# Patient Record
Sex: Male | Born: 1980 | Race: White | Hispanic: No | Marital: Single | Smoking: Current every day smoker
Health system: Southern US, Community
[De-identification: ages and names within clinical notes are randomized; demographics above are authoritative.]

## PROBLEM LIST (undated history)

## (undated) DIAGNOSIS — I10 Essential (primary) hypertension: Secondary | ICD-10-CM

## (undated) DIAGNOSIS — G4730 Sleep apnea, unspecified: Secondary | ICD-10-CM

## (undated) HISTORY — DX: Essential (primary) hypertension: I10

## (undated) HISTORY — PX: WISDOM TOOTH EXTRACTION: SHX21

## (undated) HISTORY — PX: OTHER SURGICAL HISTORY: SHX169

---

## 2007-11-10 ENCOUNTER — Emergency Department: Payer: Self-pay | Admitting: Emergency Medicine
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
## 2012-09-17 DIAGNOSIS — I10 Essential (primary) hypertension: Secondary | ICD-10-CM | POA: Insufficient documentation

## 2013-06-26 ENCOUNTER — Encounter: Payer: Self-pay | Admitting: *Deleted

## 2013-06-28 ENCOUNTER — Ambulatory Visit: Payer: Self-pay | Admitting: Podiatry

## 2013-07-05 ENCOUNTER — Encounter: Payer: Self-pay | Admitting: Podiatry

## 2013-07-05 ENCOUNTER — Ambulatory Visit (INDEPENDENT_AMBULATORY_CARE_PROVIDER_SITE_OTHER): Payer: Managed Care, Other (non HMO) | Admitting: Podiatry

## 2013-07-05 VITALS — BP 128/86 | HR 87 | Resp 16 | Ht 70.0 in | Wt 336.0 lb

## 2013-07-05 DIAGNOSIS — L84 Corns and callosities: Secondary | ICD-10-CM

## 2013-07-05 NOTE — Progress Notes (Signed)
Subjective:     Patient ID: Eugene Mata, male   DOB: [birth year], 32 y.o.   MRN: [id number]  HPI patient states I need to have my calluses trimmed   Review of Systems  All other systems reviewed and are negative.       Objective:   Physical Exam  Nursing note and vitals reviewed. Musculoskeletal: Normal range of motion.   severe callus formation plantar aspect of both feet    Assessment:     Callus formation secondary to pressure bilateral    Plan:     Debridement of lesions plantar aspect of the

## 2014-02-07 ENCOUNTER — Ambulatory Visit (INDEPENDENT_AMBULATORY_CARE_PROVIDER_SITE_OTHER): Payer: Managed Care, Other (non HMO) | Admitting: Podiatry

## 2014-02-07 ENCOUNTER — Encounter: Payer: Self-pay | Admitting: Podiatry

## 2014-02-07 ENCOUNTER — Ambulatory Visit: Payer: Managed Care, Other (non HMO) | Admitting: Podiatry

## 2014-02-07 VITALS — BP 120/81 | HR 90 | Resp 16

## 2014-02-07 DIAGNOSIS — M779 Enthesopathy, unspecified: Secondary | ICD-10-CM

## 2014-02-07 DIAGNOSIS — L84 Corns and callosities: Secondary | ICD-10-CM

## 2014-02-07 NOTE — Progress Notes (Signed)
Subjective:     Patient ID: Eugene Mata, male   DOB: [birth year], 33 y.o.   MRN: [id number]  HPI patient presents stating I am doing better with my feet with orthotics but they're starting to wear down and I need a new pair and also beneath the callus trimmed and whether or not surgery will be in my future   Review of Systems     Objective:   Physical Exam Neurovascular status intact with severe keratotic lesion sub-one sub-5 both feet fifth digit right they're nucleated and painful when pressed along with the big toes of both feet there is fluid buildup underneath the lesions    Assessment:     Capsulitis with inflammatory keratotic lesions noted on both feet that are painful    Plan:     Review different options and at this time continue conservative care with deep debridement accomplished today and patient will have new orthotics fabricated and we will try to rotate between the 2 pair of point when they are ready

## 2014-02-14 ENCOUNTER — Ambulatory Visit: Payer: Self-pay | Admitting: Medical

## 2014-02-21 ENCOUNTER — Encounter: Payer: Self-pay | Admitting: *Deleted

## 2014-02-21 NOTE — Progress Notes (Signed)
Sent pt post card letting him know orthotics are here. 

## 2014-03-14 ENCOUNTER — Encounter: Payer: Self-pay | Admitting: Podiatry

## 2014-03-14 DIAGNOSIS — M79609 Pain in unspecified limb: Secondary | ICD-10-CM

## 2014-05-23 ENCOUNTER — Encounter: Payer: Self-pay | Admitting: Podiatry

## 2014-05-23 ENCOUNTER — Ambulatory Visit (INDEPENDENT_AMBULATORY_CARE_PROVIDER_SITE_OTHER): Payer: Managed Care, Other (non HMO) | Admitting: Podiatry

## 2014-05-23 VITALS — BP 123/82 | HR 98 | Resp 16

## 2014-05-23 DIAGNOSIS — M779 Enthesopathy, unspecified: Secondary | ICD-10-CM

## 2014-05-23 DIAGNOSIS — L84 Corns and callosities: Secondary | ICD-10-CM

## 2014-05-26 NOTE — Progress Notes (Signed)
Subjective:     Patient ID: Eugene Mata, male   DOB: 16-Oct-1980, 33 y.o.   MRN: [id number]  HPI patient presents stating I know I'm going to eventually need surgery but I want to continue conservative care   Review of Systems     Objective:   Physical Exam Severe keratotic lesions of both feet that are painful when pressed along with severe flatfoot deformity and digital deformity    Assessment:     Chronic structural abnormalities creating problem    Plan:     Instructed on routine care dispensed orthotics and debridement lesions with instructions on reduced activity at work. Reappoint her recheck if symptoms persist

## 2016-07-29 ENCOUNTER — Ambulatory Visit
Admission: RE | Admit: 2016-07-29 | Discharge: 2016-07-29 | Disposition: A | Discharge status: Home / Self Care | Payer: 59 | Source: Ambulatory Visit | Attending: Physician Assistant | Admitting: Physician Assistant

## 2016-07-29 ENCOUNTER — Other Ambulatory Visit: Payer: Self-pay | Admitting: Physician Assistant

## 2016-07-29 ENCOUNTER — Ambulatory Visit
Admission: RE | Admit: 2016-07-29 | Discharge: 2016-07-29 | Disposition: A | Discharge status: Home / Self Care | Payer: 59 | Source: Ambulatory Visit | Attending: Family Medicine | Admitting: Family Medicine

## 2016-07-29 DIAGNOSIS — M545 Low back pain: Secondary | ICD-10-CM | POA: Diagnosis not present

## 2016-07-29 DIAGNOSIS — M47894 Other spondylosis, thoracic region: Secondary | ICD-10-CM | POA: Insufficient documentation

## 2017-02-24 ENCOUNTER — Ambulatory Visit (INDEPENDENT_AMBULATORY_CARE_PROVIDER_SITE_OTHER): Payer: Managed Care, Other (non HMO) | Admitting: Podiatry

## 2017-02-24 ENCOUNTER — Encounter: Payer: Self-pay | Admitting: Podiatry

## 2017-02-24 DIAGNOSIS — M79672 Pain in left foot: Secondary | ICD-10-CM | POA: Diagnosis not present

## 2017-02-24 DIAGNOSIS — M2142 Flat foot [pes planus] (acquired), left foot: Secondary | ICD-10-CM

## 2017-02-24 DIAGNOSIS — M79671 Pain in right foot: Secondary | ICD-10-CM

## 2017-02-24 DIAGNOSIS — M2141 Flat foot [pes planus] (acquired), right foot: Secondary | ICD-10-CM

## 2017-02-24 DIAGNOSIS — Q828 Other specified congenital malformations of skin: Secondary | ICD-10-CM | POA: Diagnosis not present

## 2017-02-24 DIAGNOSIS — M7742 Metatarsalgia, left foot: Secondary | ICD-10-CM

## 2017-02-24 DIAGNOSIS — M7741 Metatarsalgia, right foot: Secondary | ICD-10-CM

## 2017-02-26 NOTE — Progress Notes (Addendum)
   Subjective: Patient presents to the office today for chief complaint of painful callus lesions of the feet. Patient states that the pain is ongoing and is affecting their ability to ambulate without pain. Patient presents today for further treatment and evaluation.  Objective:  Physical Exam General: Alert and oriented x3 in no acute distress  Dermatology: Hyperkeratotic lesion present on the bilaterally x 5. Pain on palpation with a central nucleated core noted.  Skin is warm, dry and supple bilateral lower extremities. Negative for open lesions or macerations.  Vascular: Palpable pedal pulses bilaterally. No edema or erythema noted. Capillary refill within normal limits.  Neurological: Epicritic and protective threshold grossly intact bilaterally.   Musculoskeletal Exam: Pain on palpation at the keratotic lesion noted. Range of motion within normal limits bilateral. Muscle strength 5/5 in all groups bilateral.  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  #3 Treated area(s) with Salinocaine and dressed with light dressing. #4 Scanned for new orthotics.  #5 Patient is to return 3-4 weeks to pick up orthotics.   Felecia ShellingBrent M. Evans, DPM [facility]  Dr. Felecia ShellingBrent M. Evans, DPM    [address]                                        [place], [place] [address]                Office [phone]  Fax [phone]

## 2017-04-26 ENCOUNTER — Encounter: Payer: Self-pay | Admitting: *Deleted

## 2017-10-07 DIAGNOSIS — Z800 Family history of malignant neoplasm of digestive organs: Secondary | ICD-10-CM | POA: Diagnosis not present

## 2017-10-07 DIAGNOSIS — I10 Essential (primary) hypertension: Secondary | ICD-10-CM | POA: Diagnosis not present

## 2017-11-11 IMAGING — CR DG LUMBAR SPINE COMPLETE 4+V
1 series · 5 of 5 positions shown · non-contrast
Comparison: None.

CLINICAL DATA: 35-year-old male with a history of lumbar back pain

EXAM:
LUMBAR SPINE - COMPLETE 4+ VIEW

[Series 1: dg lumbar spine complete 4 +v · 0.14mm/px · 5 of 5 slices shown]
[im 1/5]
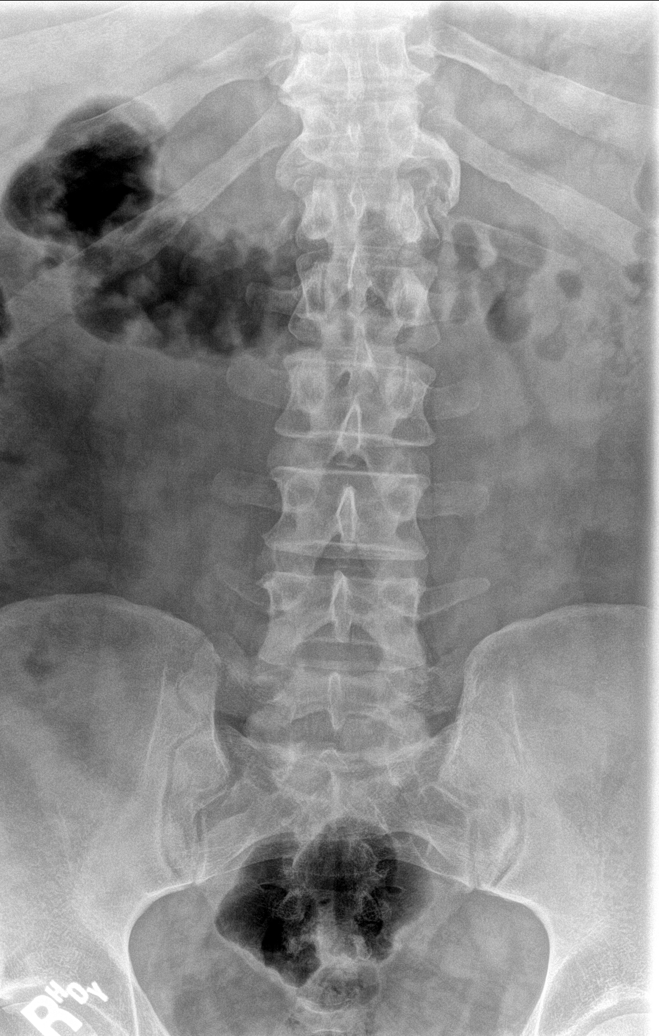
[im 2/5]
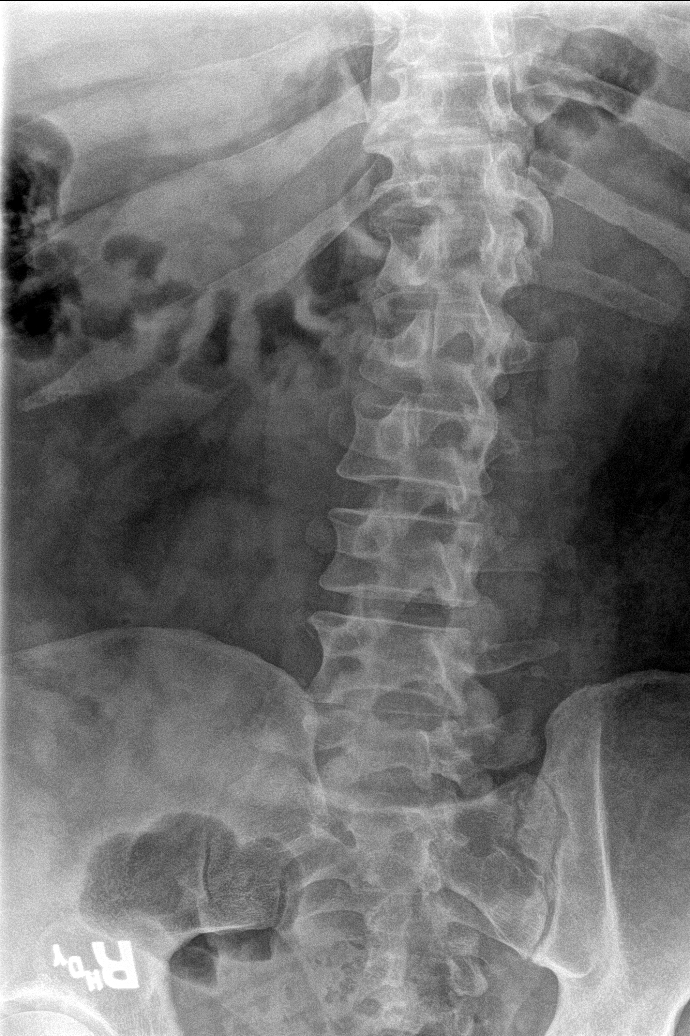
[im 3/5]
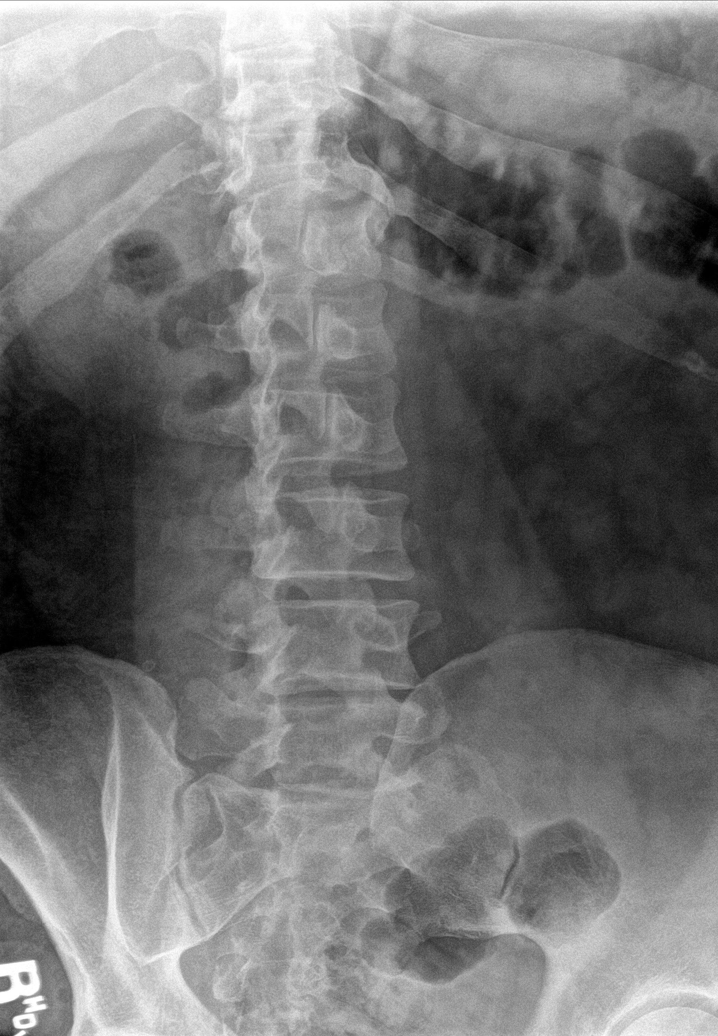
[im 4/5]
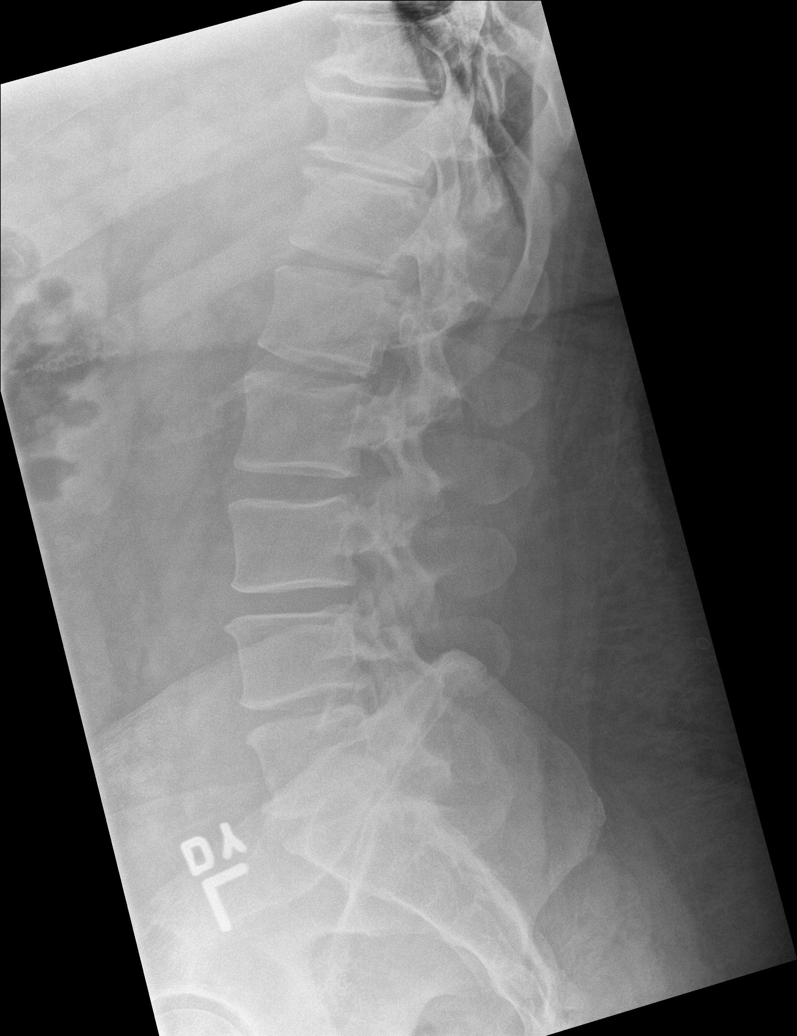
[im 5/5]
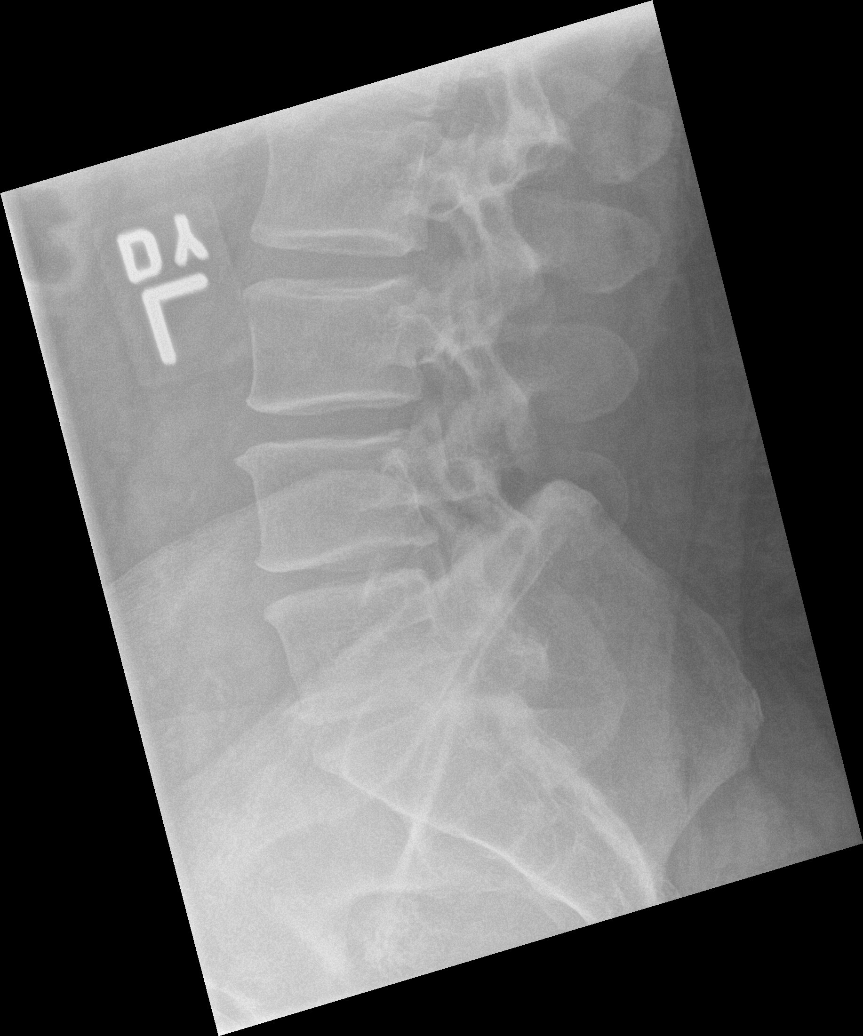

[5 of 5 positions shown; findings below may reference images not displayed]

FINDINGS: Lumbar Spine:

Numbering system presumes a transitional vertebral body at the L6-S1
level with lumbarization of the first sacral element. This also
presumes a set of vestigial ribs at the L1 level. No chest x-ray
available for comparison.

Lumbar vertebral elements maintain normal alignment without evidence
of anterolisthesis, retrolisthesis, subluxation.

No fracture line identified.  Vertebral body heights maintained.

Degenerative disc disease worst in the lower thoracic spine with
anterior osteophyte production and endplate sclerosis.

Oblique images demonstrate no displaced pars defect.

Unremarkable appearance of the visualized abdomen.
IMPRESSION: Negative for acute fracture or malalignment of the lumbar spine.

Degenerative changes of the lower thoracic levels.

Numbering system of the lumbar elements, as above.

Insert

## 2017-11-14 DIAGNOSIS — G4733 Obstructive sleep apnea (adult) (pediatric): Secondary | ICD-10-CM | POA: Diagnosis not present

## 2017-12-01 DIAGNOSIS — Z800 Family history of malignant neoplasm of digestive organs: Secondary | ICD-10-CM | POA: Diagnosis not present

## 2018-01-15 DIAGNOSIS — L720 Epidermal cyst: Secondary | ICD-10-CM | POA: Diagnosis not present

## 2018-02-05 ENCOUNTER — Ambulatory Visit: Admission: RE | Admit: 2018-02-05 | Payer: 59 | Source: Ambulatory Visit | Admitting: Gastroenterology

## 2018-02-05 ENCOUNTER — Encounter: Admission: RE | Payer: Self-pay | Source: Ambulatory Visit

## 2018-02-05 SURGERY — COLONOSCOPY WITH PROPOFOL
Anesthesia: General

## 2018-02-20 DIAGNOSIS — G4733 Obstructive sleep apnea (adult) (pediatric): Secondary | ICD-10-CM | POA: Diagnosis not present

## 2018-03-08 ENCOUNTER — Ambulatory Visit
Admission: RE | Admit: 2018-03-08 | Discharge: 2018-03-08 | Disposition: A | Discharge status: Home / Self Care | Payer: BLUE CROSS/BLUE SHIELD | Source: Ambulatory Visit | Attending: Gastroenterology | Admitting: Gastroenterology

## 2018-03-08 ENCOUNTER — Ambulatory Visit: Payer: BLUE CROSS/BLUE SHIELD | Admitting: Certified Registered"

## 2018-03-08 ENCOUNTER — Encounter: Payer: Self-pay | Admitting: *Deleted

## 2018-03-08 ENCOUNTER — Encounter
Admission: RE | Disposition: A | Discharge status: Home / Self Care | Payer: Self-pay | Source: Ambulatory Visit | Attending: Gastroenterology

## 2018-03-08 DIAGNOSIS — Z6841 Body Mass Index (BMI) 40.0 and over, adult: Secondary | ICD-10-CM | POA: Insufficient documentation

## 2018-03-08 DIAGNOSIS — K635 Polyp of colon: Secondary | ICD-10-CM | POA: Insufficient documentation

## 2018-03-08 DIAGNOSIS — D128 Benign neoplasm of rectum: Secondary | ICD-10-CM | POA: Diagnosis not present

## 2018-03-08 DIAGNOSIS — Z800 Family history of malignant neoplasm of digestive organs: Secondary | ICD-10-CM | POA: Insufficient documentation

## 2018-03-08 DIAGNOSIS — F17200 Nicotine dependence, unspecified, uncomplicated: Secondary | ICD-10-CM | POA: Insufficient documentation

## 2018-03-08 DIAGNOSIS — I10 Essential (primary) hypertension: Secondary | ICD-10-CM | POA: Diagnosis not present

## 2018-03-08 DIAGNOSIS — Z8371 Family history of colonic polyps: Secondary | ICD-10-CM | POA: Insufficient documentation

## 2018-03-08 DIAGNOSIS — K5730 Diverticulosis of large intestine without perforation or abscess without bleeding: Secondary | ICD-10-CM | POA: Insufficient documentation

## 2018-03-08 DIAGNOSIS — Z1211 Encounter for screening for malignant neoplasm of colon: Secondary | ICD-10-CM | POA: Insufficient documentation

## 2018-03-08 DIAGNOSIS — D125 Benign neoplasm of sigmoid colon: Secondary | ICD-10-CM | POA: Diagnosis not present

## 2018-03-08 DIAGNOSIS — D127 Benign neoplasm of rectosigmoid junction: Secondary | ICD-10-CM | POA: Diagnosis not present

## 2018-03-08 DIAGNOSIS — Z9989 Dependence on other enabling machines and devices: Secondary | ICD-10-CM | POA: Insufficient documentation

## 2018-03-08 DIAGNOSIS — G4730 Sleep apnea, unspecified: Secondary | ICD-10-CM | POA: Insufficient documentation

## 2018-03-08 DIAGNOSIS — K621 Rectal polyp: Secondary | ICD-10-CM | POA: Insufficient documentation

## 2018-03-08 DIAGNOSIS — K5790 Diverticulosis of intestine, part unspecified, without perforation or abscess without bleeding: Secondary | ICD-10-CM | POA: Diagnosis not present

## 2018-03-08 HISTORY — DX: Sleep apnea, unspecified: G47.30

## 2018-03-08 HISTORY — PX: COLONOSCOPY WITH PROPOFOL: SHX5780

## 2018-03-08 SURGERY — COLONOSCOPY WITH PROPOFOL
Anesthesia: General

## 2018-03-08 MED ORDER — PROPOFOL 500 MG/50ML IV EMUL
INTRAVENOUS | Status: DC | PRN
  Administered 2018-03-08: 125 ug/kg/min via INTRAVENOUS

## 2018-03-08 MED ORDER — PROPOFOL 10 MG/ML IV BOLUS
INTRAVENOUS | Status: AC
  Filled 2018-03-08: qty 40

## 2018-03-08 MED ORDER — PROPOFOL 500 MG/50ML IV EMUL
INTRAVENOUS | Status: AC
  Filled 2018-03-08: qty 50

## 2018-03-08 MED ORDER — LIDOCAINE HCL (CARDIAC) PF 100 MG/5ML IV SOSY
PREFILLED_SYRINGE | INTRAVENOUS | Status: DC | PRN
  Administered 2018-03-08: 80 mg via INTRAVENOUS

## 2018-03-08 MED ORDER — PROPOFOL 10 MG/ML IV BOLUS
INTRAVENOUS | Status: AC
  Filled 2018-03-08: qty 20

## 2018-03-08 MED ORDER — SODIUM CHLORIDE 0.9 % IV SOLN
INTRAVENOUS | Status: DC
  Administered 2018-03-08: 12:00:00 via INTRAVENOUS

## 2018-03-08 MED ORDER — PROPOFOL 10 MG/ML IV BOLUS
INTRAVENOUS | Status: DC | PRN
  Administered 2018-03-08: 100 mg via INTRAVENOUS
  Administered 2018-03-08 (×2): 50 mg via INTRAVENOUS

## 2018-03-08 NOTE — Anesthesia Postprocedure Evaluation (Signed)
Anesthesia Post Note  Patient: Eugene Mata  Procedure(s) Performed: COLONOSCOPY WITH PROPOFOL (N/A )  Patient location during evaluation: Endoscopy Anesthesia Type: General Level of consciousness: awake and alert Pain management: pain level controlled Vital Signs Assessment: post-procedure vital signs reviewed and stable Respiratory status: spontaneous breathing, nonlabored ventilation, respiratory function stable and patient connected to nasal cannula oxygen Cardiovascular status: blood pressure returned to baseline and stable Postop Assessment: no apparent nausea or vomiting Anesthetic complications: no     Last Vitals:  Vitals:   03/08/18 1235 03/08/18 1245  BP: 102/65 116/67  Pulse:    Resp:    Temp:    SpO2:      Last Pain:  Vitals:   03/08/18 1245  TempSrc:   PainSc: 0-No pain                 Cleda MccreedyJoseph K Julieth Tugman

## 2018-03-08 NOTE — H&P (Signed)
Outpatient short stay form Pre-procedure 03/08/2018 11:35 AM Eugene DeemMartin U Skulskie MD  Primary Physician: Iantha FallenLeonard Polanco, MD  Reason for visit: Colonoscopy  History of present illness: Patient is a [age] year old male presenting today as above.  There is a family history of colon cancer in a primary relative, sister the age of about 6444.  Colon polyps in her brother in his 2040s.  His prep well.  He takes no aspirin or blood thinning agent.    Current Facility-Administered Medications:  .  0.9 %  sodium chloride infusion, , Intravenous, Continuous, Eugene DeemSkulskie, Martin U, MD, Last Rate: 20 mL/hr at 03/08/18 1132  Medications Prior to Admission  Medication Sig Dispense Refill Last Dose  . lisinopril-hydrochlorothiazide (PRINZIDE,ZESTORETIC) 20-25 MG per tablet Take 1 tablet by mouth daily.   03/08/2018 at 0600     No Known Allergies   Past Medical History:  Diagnosis Date  . Hypertension   . Sleep apnea    uses CPAP at home    Review of systems:      Physical Exam    Heart and lungs: Regular rate and rhythm without rub or gallop, lungs are bilaterally clear.    HEENT: Normocephalic atraumatic eyes are anicteric    Other:    Pertinant exam for procedure: Soft nontender nondistended bowel sounds positive normoactive    Planned proceedures: Colonoscopy and indicated procedures. I have discussed the risks benefits and complications of procedures to include not limited to bleeding, infection, perforation and the risk of sedation and the patient wishes to proceed.    Eugene DeemMartin U Skulskie, MD Gastroenterology 03/08/2018  11:35 AM

## 2018-03-08 NOTE — Transfer of Care (Signed)
Immediate Anesthesia Transfer of Care Note  Patient: Eugene Mata  Procedure(s) Performed: COLONOSCOPY WITH PROPOFOL (N/A )  Patient Location: PACU  Anesthesia Type:General  Level of Consciousness: awake, alert  and oriented  Airway & Oxygen Therapy: Patient Spontanous Breathing and Patient connected to nasal cannula oxygen  Post-op Assessment: Report given to RN and Post -op Vital signs reviewed and stable  Post vital signs: Reviewed and stable  Last Vitals:  Vitals Value Taken Time  BP    Temp    Pulse 84 03/08/2018 12:28 PM  Resp 16 03/08/2018 12:28 PM  SpO2 99 % 03/08/2018 12:28 PM  Vitals shown include unvalidated device data.  Last Pain:  Vitals:   03/08/18 1225  TempSrc: (P) Tympanic  PainSc:          Complications: No apparent anesthesia complications

## 2018-03-08 NOTE — Anesthesia Preprocedure Evaluation (Signed)
Anesthesia Evaluation  Patient identified by MRN, date of birth, ID band Patient awake    Reviewed: Allergy & Precautions, H&P , NPO status , Patient's Chart, lab work & pertinent test results  History of Anesthesia Complications Negative for: history of anesthetic complications  Airway Mallampati: III  TM Distance: >3 FB Neck ROM: full    Dental  (+) Chipped, Poor Dentition   Pulmonary sleep apnea and Continuous Positive Airway Pressure Ventilation , Current Smoker,           Cardiovascular Exercise Tolerance: Good hypertension, (-) angina(-) Past MI and (-) DOE      Neuro/Psych negative neurological ROS  negative psych ROS   GI/Hepatic negative GI ROS, Neg liver ROS,   Endo/Other  Morbid obesity  Renal/GU negative Renal ROS  negative genitourinary   Musculoskeletal   Abdominal   Peds  Hematology negative hematology ROS (+)   Anesthesia Other Findings Past Medical History: No date: Hypertension No date: Sleep apnea     Comment:  uses CPAP at home  Past Surgical History: No date: head cyst removal No date: WISDOM TOOTH EXTRACTION  BMI    Body Mass Index:  41.61 kg/m      Reproductive/Obstetrics negative OB ROS                             Anesthesia Physical Anesthesia Plan  ASA: III  Anesthesia Plan: General   Post-op Pain Management:    Induction: Intravenous  PONV Risk Score and Plan: Propofol infusion and TIVA  Airway Management Planned: Natural Airway and Nasal Cannula  Additional Equipment:   Intra-op Plan:   Post-operative Plan:   Informed Consent: I have reviewed the patients History and Physical, chart, labs and discussed the procedure including the risks, benefits and alternatives for the proposed anesthesia with the patient or authorized representative who has indicated his/her understanding and acceptance.   Dental Advisory Given  Plan Discussed  with: Anesthesiologist, CRNA and Surgeon  Anesthesia Plan Comments: (Patient consented for risks of anesthesia including but not limited to:  - adverse reactions to medications - risk of intubation if required - damage to teeth, lips or other oral mucosa - sore throat or hoarseness - Damage to heart, brain, lungs or loss of life  Patient voiced understanding.)        Anesthesia Quick Evaluation

## 2018-03-08 NOTE — Op Note (Signed)
[facility] Patient Name: Eugene Mata Procedure Date: 03/08/2018 11:41 AM MRN: [id number] Account #: [id number] Date of Birth: [birth year] Admit Type: Outpatient Age: 37 Room: [facility] ENDO ROOM 3 Gender: Male Note Status: Finalized Procedure:            Colonoscopy Indications:          Family history of colon cancer in a first-degree                        relative, Family history of colonic polyps in a                        first-degree relative Providers:            Christena Deem, MD Medicines:            Monitored Anesthesia Care Complications:        No immediate complications. Procedure:            Pre-Anesthesia Assessment:                       - ASA Grade Assessment: III - A patient with severe                        systemic disease.                       After obtaining informed consent, the colonoscope was                        passed under direct vision. Throughout the procedure,                        the patient's blood pressure, pulse, and oxygen                        saturations were monitored continuously. The                        Colonoscope was introduced through the anus and                        advanced to the the cecum, identified by appendiceal                        orifice and ileocecal valve. The colonoscopy was                        performed without difficulty. The patient tolerated the                        procedure well. The quality of the bowel preparation                        was good. Findings:      Three sessile polyps were found in the recto-sigmoid colon. The polyps       were 2 to 5 mm in size. These polyps were removed with a cold snare.       Resection and retrieval were complete.      A 6 mm polyp was found in the distal sigmoid colon. The polyp was  sessile. The polyp was removed with a cold snare. Resection and       retrieval were complete.      Five sessile polyps were found in the  rectum. The polyps were 1 to 3 mm       in size. These polyps were removed with a cold biopsy forceps. Resection       and retrieval were complete.      A few small-mouthed diverticula were found in the sigmoid colon,       descending colon and distal descending colon.      The digital rectal exam was normal.      The retroflexed view of the distal rectum and anal verge was normal and       showed no anal or rectal abnormalities. Impression:           - Three 2 to 5 mm polyps at the recto-sigmoid colon,                        removed with a cold snare. Resected and retrieved.                       - One 6 mm polyp in the distal sigmoid colon, removed                        with a cold snare. Resected and retrieved.                       - Five 1 to 3 mm polyps in the rectum, removed with a                        cold biopsy forceps. Resected and retrieved.                       - Diverticulosis in the sigmoid colon, in the                        descending colon and in the distal descending colon.                       - The distal rectum and anal verge are normal on                        retroflexion view. Recommendation:       - Discharge patient to home. Procedure Code(s):    --- Professional ---                       [phone] [id number], Colonoscopy, flexible; with removal of tumor(s),                        polyp(s), or other lesion(s) by snare technique                       45380, 59, Colonoscopy, flexible; with biopsy, single                        or multiple Diagnosis Code(s):    --- Professional ---                       D12.7, Benign  neoplasm of rectosigmoid junction                       K62.1, Rectal polyp                       D12.5, Benign neoplasm of sigmoid colon                       Z80.0, Family history of malignant neoplasm of                        digestive organs                       Z83.71, Family history of colonic polyps                       K57.30, Diverticulosis of  large intestine without                        perforation or abscess without bleeding CPT copyright 2017 American Medical Association. All rights reserved. The codes documented in this report are preliminary and upon coder review may  be revised to meet current compliance requirements. Christena Deem, MD 03/08/2018 12:25:58 PM This report has been signed electronically. Number of Addenda: 0 Note Initiated On: 03/08/2018 11:41 AM Scope Withdrawal Time: 0 hours 17 minutes 32 seconds  Total Procedure Duration: 0 hours 32 minutes 11 seconds       [facility]

## 2018-03-08 NOTE — Anesthesia Post-op Follow-up Note (Signed)
Anesthesia QCDR form completed.        

## 2018-03-09 LAB — SURGICAL PATHOLOGY

## 2018-05-01 DIAGNOSIS — L988 Other specified disorders of the skin and subcutaneous tissue: Secondary | ICD-10-CM | POA: Diagnosis not present

## 2018-05-01 DIAGNOSIS — L905 Scar conditions and fibrosis of skin: Secondary | ICD-10-CM | POA: Diagnosis not present

## 2018-05-01 DIAGNOSIS — D485 Neoplasm of uncertain behavior of skin: Secondary | ICD-10-CM | POA: Diagnosis not present

## 2018-05-01 DIAGNOSIS — L720 Epidermal cyst: Secondary | ICD-10-CM | POA: Diagnosis not present

## 2018-05-08 DIAGNOSIS — L905 Scar conditions and fibrosis of skin: Secondary | ICD-10-CM | POA: Diagnosis not present

## 2018-05-08 DIAGNOSIS — L720 Epidermal cyst: Secondary | ICD-10-CM | POA: Diagnosis not present

## 2018-05-08 DIAGNOSIS — D1739 Benign lipomatous neoplasm of skin and subcutaneous tissue of other sites: Secondary | ICD-10-CM | POA: Diagnosis not present

## 2018-05-09 DIAGNOSIS — I10 Essential (primary) hypertension: Secondary | ICD-10-CM | POA: Diagnosis not present

## 2018-10-03 DIAGNOSIS — G4733 Obstructive sleep apnea (adult) (pediatric): Secondary | ICD-10-CM | POA: Diagnosis not present

## 2018-11-17 DIAGNOSIS — I10 Essential (primary) hypertension: Secondary | ICD-10-CM | POA: Diagnosis not present

## 2018-11-17 DIAGNOSIS — F17290 Nicotine dependence, other tobacco product, uncomplicated: Secondary | ICD-10-CM | POA: Diagnosis not present

## 2019-01-02 DIAGNOSIS — G4733 Obstructive sleep apnea (adult) (pediatric): Secondary | ICD-10-CM | POA: Diagnosis not present

## 2019-04-02 DIAGNOSIS — G4733 Obstructive sleep apnea (adult) (pediatric): Secondary | ICD-10-CM | POA: Diagnosis not present

## 2019-05-06 DIAGNOSIS — K635 Polyp of colon: Secondary | ICD-10-CM | POA: Diagnosis not present

## 2019-05-06 DIAGNOSIS — E663 Overweight: Secondary | ICD-10-CM | POA: Diagnosis not present

## 2019-05-06 DIAGNOSIS — I10 Essential (primary) hypertension: Secondary | ICD-10-CM | POA: Diagnosis not present

## 2019-05-07 DIAGNOSIS — R5381 Other malaise: Secondary | ICD-10-CM | POA: Diagnosis not present

## 2019-05-07 DIAGNOSIS — E7849 Other hyperlipidemia: Secondary | ICD-10-CM | POA: Diagnosis not present

## 2019-05-07 DIAGNOSIS — I10 Essential (primary) hypertension: Secondary | ICD-10-CM | POA: Diagnosis not present

## 2019-05-14 DIAGNOSIS — E662 Morbid (severe) obesity with alveolar hypoventilation: Secondary | ICD-10-CM | POA: Diagnosis not present

## 2019-05-14 DIAGNOSIS — I119 Hypertensive heart disease without heart failure: Secondary | ICD-10-CM | POA: Diagnosis not present

## 2019-06-03 ENCOUNTER — Ambulatory Visit: Payer: BLUE CROSS/BLUE SHIELD | Admitting: Internal Medicine

## 2019-07-09 DIAGNOSIS — G4733 Obstructive sleep apnea (adult) (pediatric): Secondary | ICD-10-CM | POA: Diagnosis not present

## 2019-10-29 ENCOUNTER — Other Ambulatory Visit: Payer: Self-pay

## 2019-10-29 ENCOUNTER — Telehealth: Payer: Self-pay

## 2019-10-29 ENCOUNTER — Encounter: Payer: Self-pay | Admitting: Family Medicine

## 2019-10-29 ENCOUNTER — Ambulatory Visit (INDEPENDENT_AMBULATORY_CARE_PROVIDER_SITE_OTHER): Payer: BC Managed Care – PPO | Admitting: Family Medicine

## 2019-10-29 VITALS — BP 123/71 | HR 84 | Temp 97.9°F | Resp 16 | Ht 69.5 in | Wt 377.5 lb

## 2019-10-29 DIAGNOSIS — I10 Essential (primary) hypertension: Secondary | ICD-10-CM

## 2019-10-29 DIAGNOSIS — K635 Polyp of colon: Secondary | ICD-10-CM | POA: Insufficient documentation

## 2019-10-29 DIAGNOSIS — Z720 Tobacco use: Secondary | ICD-10-CM | POA: Diagnosis not present

## 2019-10-29 DIAGNOSIS — G4733 Obstructive sleep apnea (adult) (pediatric): Secondary | ICD-10-CM | POA: Insufficient documentation

## 2019-10-29 DIAGNOSIS — Z800 Family history of malignant neoplasm of digestive organs: Secondary | ICD-10-CM | POA: Insufficient documentation

## 2019-10-29 NOTE — Assessment & Plan Note (Signed)
Return in 5 years. Updated health maintenance

## 2019-10-29 NOTE — Assessment & Plan Note (Signed)
BP at goal. Continue lisinopril/HCTZ. Encouraged diet/exercise. Will get outside labs. Return in 4 months for repeat annual labs.

## 2019-10-29 NOTE — Telephone Encounter (Signed)
Left message for patient to call back. Wanted to see if patient remembers his previous doctor's address he was seen after Urgent Care in [place] closed. Previous doctor's name I have is Dr Juel Burrow but per google there are several possible locations for this doctor.

## 2019-10-29 NOTE — Assessment & Plan Note (Signed)
Encouraged weight loss. Pt will try working on diet and add exercise later. Discussed possibility of surgery - not interested at this time, also unsure what the insurance coverage would be.

## 2019-10-29 NOTE — Assessment & Plan Note (Signed)
Uses CPAP but interested in a new one. Advised him to contact supply to send over request to our office. Encouraged weight loss as a way to decrease sleep apnea.

## 2019-10-29 NOTE — Assessment & Plan Note (Signed)
Not interested in quitting at this time

## 2019-10-29 NOTE — Progress Notes (Signed)
Subjective:     Eugene Mata is a 39 y.o. male presenting for Establish Care (was going to [facility] but they closed down. then was seen Dr Corky Downs)     HPI   #HTN - takes medication every morning - no cp, sob, ha - combination lisinopril-HCTZ 20-25  #Sleep apnea - interested in a new machine  #Tobacco use - not interested in quitting  #Obesity - previously had lost down to 290 - was doing plan Z > using a spray and low calorie diet  Review of Systems   Social History   Tobacco Use  Smoking Status Current Every Day Smoker  . Packs/day: 1.00  . Years: 23.00  . Pack years: 23.00  . Types: Cigarettes  Smokeless Tobacco Former Neurosurgeon  . Types: Snuff  . Quit date: 1999        Objective:    BP Readings from Last 3 Encounters:  10/29/19 123/71  03/08/18 116/67  05/23/14 123/82   Wt Readings from Last 3 Encounters:  10/29/19 (!) 377 lb 8 oz (171.2 kg)  03/08/18 290 lb (131.5 kg)  07/05/13 (!) 336 lb (152.4 kg)    BP 123/71   Pulse 84   Temp 97.9 F (36.6 C)   Resp 16   Ht 5' 9.5" (1.765 m)   Wt (!) 377 lb 8 oz (171.2 kg)   SpO2 96%   BMI 54.95 kg/m    Physical Exam Constitutional:      Appearance: Normal appearance. He is obese. He is not ill-appearing or diaphoretic.  HENT:     Right Ear: External ear normal.     Left Ear: External ear normal.     Nose: Nose normal.  Eyes:     General: No scleral icterus.    Extraocular Movements: Extraocular movements intact.     Conjunctiva/sclera: Conjunctivae normal.  Cardiovascular:     Rate and Rhythm: Normal rate and regular rhythm.     Heart sounds: No murmur.  Pulmonary:     Effort: Pulmonary effort is normal. No respiratory distress.     Breath sounds: Normal breath sounds. No wheezing.  Musculoskeletal:     Cervical back: Neck supple.  Skin:    General: Skin is warm and dry.  Neurological:     Mental Status: He is alert. Mental status is at baseline.  Psychiatric:      Mood and Affect: Mood normal.           Assessment & Plan:   Problem List Items Addressed This Visit      Cardiovascular and Mediastinum   HTN (hypertension) - Primary    BP at goal. Continue lisinopril/HCTZ. Encouraged diet/exercise. Will get outside labs. Return in 4 months for repeat annual labs.         Respiratory   Sleep apnea, obstructive    Uses CPAP but interested in a new one. Advised him to contact supply to send over request to our office. Encouraged weight loss as a way to decrease sleep apnea.         Digestive   Colon polyps    Return in 5 years. Updated health maintenance         Other   Morbid obesity (HCC)    Encouraged weight loss. Pt will try working on diet and add exercise later. Discussed possibility of surgery - not interested at this time, also unsure what the insurance coverage would be.       Tobacco  use    Not interested in quitting at this time      Family history of colon cancer       Return in about 4 months (around 02/26/2020).  Lesleigh Noe, MD

## 2019-10-29 NOTE — Patient Instructions (Addendum)
#   Weight loss - Start working on [employer] - weight loss could decrease your need for your CPAP machine and you may not need as many blood pressure medications  - return in 4 months  #Blood pressure - keep taking your medication  #CPAP - contact company and have them send over request for the needed supplies - with settings included

## 2019-10-30 NOTE — Telephone Encounter (Signed)
[facility] [address] [place] [address]  Phone [phone]

## 2019-10-30 NOTE — Telephone Encounter (Signed)
Noted thank you. Records requested

## 2020-01-14 DIAGNOSIS — G4733 Obstructive sleep apnea (adult) (pediatric): Secondary | ICD-10-CM | POA: Diagnosis not present

## 2020-03-30 ENCOUNTER — Other Ambulatory Visit: Payer: Self-pay

## 2020-03-30 ENCOUNTER — Ambulatory Visit (INDEPENDENT_AMBULATORY_CARE_PROVIDER_SITE_OTHER): Payer: BC Managed Care – PPO | Admitting: Family Medicine

## 2020-03-30 ENCOUNTER — Encounter: Payer: Self-pay | Admitting: Family Medicine

## 2020-03-30 VITALS — BP 100/58 | HR 78 | Temp 97.5°F | Wt 367.0 lb

## 2020-03-30 DIAGNOSIS — Z720 Tobacco use: Secondary | ICD-10-CM

## 2020-03-30 DIAGNOSIS — Z114 Encounter for screening for human immunodeficiency virus [HIV]: Secondary | ICD-10-CM | POA: Diagnosis not present

## 2020-03-30 DIAGNOSIS — Z1159 Encounter for screening for other viral diseases: Secondary | ICD-10-CM

## 2020-03-30 DIAGNOSIS — I10 Essential (primary) hypertension: Secondary | ICD-10-CM

## 2020-03-30 LAB — LIPID PANEL
Cholesterol: 185 mg/dL (ref 0–200)
HDL: 31.8 mg/dL — ABNORMAL LOW (ref >39.00)
LDL Cholesterol: 130 mg/dL — ABNORMAL HIGH (ref 0–99)
NonHDL: 153.05
Total CHOL/HDL Ratio: 6
Triglycerides: 116 mg/dL (ref 0.0–149.0)
VLDL: 23.2 mg/dL (ref 0.0–40.0)

## 2020-03-30 LAB — COMPREHENSIVE METABOLIC PANEL
ALT: 27 U/L (ref 0–53)
AST: 19 U/L (ref 0–37)
Albumin: 4 g/dL (ref 3.5–5.2)
Alkaline Phosphatase: 55 U/L (ref 39–117)
BUN: 9 mg/dL (ref 6–23)
CO2: 28 mEq/L (ref 19–32)
Calcium: 9.5 mg/dL (ref 8.4–10.5)
Chloride: 101 mEq/L (ref 96–112)
Creatinine, Ser: 0.77 mg/dL (ref 0.40–1.50)
GFR: 112.52 mL/min (ref >60.00)
Glucose, Bld: 95 mg/dL (ref 70–99)
Potassium: 3.6 mEq/L (ref 3.5–5.1)
Sodium: 137 mEq/L (ref 135–145)
Total Bilirubin: 0.4 mg/dL (ref 0.2–1.2)
Total Protein: 7.2 g/dL (ref 6.0–8.3)

## 2020-03-30 LAB — HEMOGLOBIN A1C: Hgb A1c MFr Bld: 5.6 % (ref 4.6–6.5)

## 2020-03-30 MED ORDER — LISINOPRIL 20 MG PO TABS: 20.0000 mg | ORAL_TABLET | Freq: Every day | ORAL | 3 refills | Status: DC

## 2020-03-30 NOTE — Patient Instructions (Addendum)
#  Blood pressure - After you finish the current bottle  - stop the combination medicine - fill Lisinopril 20 mg  - keep working on using the stairs at work and portion control

## 2020-03-30 NOTE — Assessment & Plan Note (Signed)
BP low and at goal. Discussed switching to lisinopril as single agent. Return in 6 months.

## 2020-03-30 NOTE — Progress Notes (Signed)
Subjective:     Eugene Mata is a 39 y.o. male presenting for Follow-up (hypertension)     HPI   #Obesity - has not made major changes - does not feel he has done anything - is back at work - use the stairs at his job - small diet changes - smaller portions  #HTN - has not been checking at home - no cp, ha, dizziness - feels fine  #Tobacco  - not interested in quitting at this time  Review of Systems   Social History   Tobacco Use  Smoking Status Current Every Day Smoker  . Packs/day: 1.00  . Years: 23.00  . Pack years: 23.00  . Types: Cigarettes  Smokeless Tobacco Former Neurosurgeon  . Types: Snuff  . Quit date: 1999        Objective:    BP Readings from Last 3 Encounters:  03/30/20 (!) 100/58  10/29/19 123/71  03/08/18 116/67   Wt Readings from Last 3 Encounters:  03/30/20 (!) 367 lb (166.5 kg)  10/29/19 (!) 377 lb 8 oz (171.2 kg)  03/08/18 290 lb (131.5 kg)    BP (!) 100/58   Pulse 78   Temp (!) 97.5 F (36.4 C) (Temporal)   Wt (!) 367 lb (166.5 kg)   SpO2 96%   BMI 53.42 kg/m    Physical Exam Constitutional:      Appearance: Normal appearance. He is obese. He is not ill-appearing or diaphoretic.  HENT:     Right Ear: External ear normal.     Left Ear: External ear normal.     Nose: Nose normal.  Eyes:     General: No scleral icterus.    Extraocular Movements: Extraocular movements intact.     Conjunctiva/sclera: Conjunctivae normal.  Cardiovascular:     Rate and Rhythm: Normal rate and regular rhythm.     Heart sounds: No murmur heard.   Pulmonary:     Effort: Pulmonary effort is normal. No respiratory distress.     Breath sounds: Normal breath sounds. No wheezing.  Musculoskeletal:     Cervical back: Neck supple.  Skin:    General: Skin is warm and dry.  Neurological:     Mental Status: He is alert. Mental status is at baseline.  Psychiatric:        Mood and Affect: Mood normal.           Assessment & Plan:    Problem List Items Addressed This Visit      Cardiovascular and Mediastinum   HTN (hypertension)    BP low and at goal. Discussed switching to lisinopril as single agent. Return in 6 months.       Relevant Medications   lisinopril (ZESTRIL) 20 MG tablet   Other Relevant Orders   Comprehensive metabolic panel     Other   Morbid obesity (HCC)    Congrats on 10 lb weight loss - has returned to work and is using stairs and working on portion control. Will get screening blood work today. Encouraged continued weight loss efforts      Relevant Orders   Comprehensive metabolic panel   Lipid panel   Hemoglobin A1c   Tobacco use - Primary    Not interested in quitting. Offered support when ready       Other Visit Diagnoses    Encounter for hepatitis C screening test for low risk patient       Relevant Orders   Hepatitis C antibody  Screening for HIV (human immunodeficiency virus)       Relevant Orders   HIV Antibody (routine testing w rflx)       Return in about 6 months (around 09/30/2020) for wellness visit.  Lynnda Child, MD  This visit occurred during the SARS-CoV-2 public health emergency.  Safety protocols were in place, including screening questions prior to the visit, additional usage of staff PPE, and extensive cleaning of exam room while observing appropriate contact time as indicated for disinfecting solutions.

## 2020-03-30 NOTE — Assessment & Plan Note (Signed)
Not interested in quitting. Offered support when ready

## 2020-03-30 NOTE — Assessment & Plan Note (Signed)
Congrats on 10 lb weight loss - has returned to work and is using stairs and working on portion control. Will get screening blood work today. Encouraged continued weight loss efforts

## 2020-03-31 LAB — HIV ANTIBODY (ROUTINE TESTING W REFLEX): HIV 1&2 Ab, 4th Generation: NONREACTIVE

## 2020-03-31 LAB — HEPATITIS C ANTIBODY
Hepatitis C Ab: NONREACTIVE
SIGNAL TO CUT-OFF: 0.01 (ref <1.00)

## 2020-07-14 DIAGNOSIS — G4733 Obstructive sleep apnea (adult) (pediatric): Secondary | ICD-10-CM | POA: Diagnosis not present

## 2020-10-01 ENCOUNTER — Encounter: Payer: BC Managed Care – PPO | Admitting: Family Medicine

## 2020-11-25 ENCOUNTER — Encounter: Payer: Self-pay | Admitting: Family Medicine

## 2020-11-25 ENCOUNTER — Other Ambulatory Visit: Payer: Self-pay

## 2020-11-25 ENCOUNTER — Ambulatory Visit (INDEPENDENT_AMBULATORY_CARE_PROVIDER_SITE_OTHER): Payer: BC Managed Care – PPO | Admitting: Family Medicine

## 2020-11-25 VITALS — BP 128/82 | HR 91 | Temp 96.9°F | Ht 69.25 in | Wt 365.0 lb

## 2020-11-25 DIAGNOSIS — I10 Essential (primary) hypertension: Secondary | ICD-10-CM

## 2020-11-25 DIAGNOSIS — E782 Mixed hyperlipidemia: Secondary | ICD-10-CM

## 2020-11-25 DIAGNOSIS — Z0000 Encounter for general adult medical examination without abnormal findings: Secondary | ICD-10-CM | POA: Diagnosis not present

## 2020-11-25 DIAGNOSIS — G4733 Obstructive sleep apnea (adult) (pediatric): Secondary | ICD-10-CM

## 2020-11-25 LAB — LIPID PANEL
Cholesterol: 194 mg/dL (ref 0–200)
HDL: 32.4 mg/dL — ABNORMAL LOW (ref >39.00)
LDL Cholesterol: 141 mg/dL — ABNORMAL HIGH (ref 0–99)
NonHDL: 161.35
Total CHOL/HDL Ratio: 6
Triglycerides: 100 mg/dL (ref 0.0–149.0)
VLDL: 20 mg/dL (ref 0.0–40.0)

## 2020-11-25 LAB — BASIC METABOLIC PANEL
BUN: 9 mg/dL (ref 6–23)
CO2: 24 mEq/L (ref 19–32)
Calcium: 9.3 mg/dL (ref 8.4–10.5)
Chloride: 103 mEq/L (ref 96–112)
Creatinine, Ser: 0.81 mg/dL (ref 0.40–1.50)
GFR: 111.01 mL/min (ref >60.00)
Glucose, Bld: 97 mg/dL (ref 70–99)
Potassium: 4 mEq/L (ref 3.5–5.1)
Sodium: 136 mEq/L (ref 135–145)

## 2020-11-25 NOTE — Assessment & Plan Note (Signed)
BP at goal. Cont lisinopril 20 mg 

## 2020-11-25 NOTE — Patient Instructions (Addendum)
[facility] [place], [place] [address]  Phone: [phone]  Fax: [phone]  Consider calling GI to make sure 5 years is correct  Reach out if you want to try this  .Semaglutide injection solution What is this medicine? SEMAGLUTIDE (Sem a GLOO tide) is used to improve blood sugar control in adults with type 2 diabetes. This medicine may be used with other diabetes medicines. This drug may also reduce the risk of heart attack or stroke if you have type 2 diabetes and risk factors for heart disease. This medicine may be used for other purposes; ask your health care provider or pharmacist if you have questions. COMMON BRAND NAME(S): OZEMPIC What should I tell my health care provider before I take this medicine? They need to know if you have any of these conditions:  endocrine tumors (MEN 2) or if someone in your family had these tumors  eye disease, vision problems  history of pancreatitis  kidney disease  stomach problems  thyroid cancer or if someone in your family had thyroid cancer  an unusual or allergic reaction to semaglutide, other medicines, foods, dyes, or preservatives  pregnant or trying to get pregnant  breast-feeding How should I use this medicine? This medicine is for injection under the skin of your upper leg (thigh), stomach area, or upper arm. It is given once every week (every 7 days). You will be taught how to prepare and give this medicine. Use exactly as directed. Take your medicine at regular intervals. Do not take it more often than directed. If you use this medicine with insulin, you should inject this medicine and the insulin separately. Do not mix them together. Do not give the injections right next to each other. Change (rotate) injection sites with each injection. It is important that you put your used needles and syringes in a special sharps container. Do not put them in a trash can. If you do not have a sharps container, call your  pharmacist or healthcare provider to get one. A special MedGuide will be given to you by the pharmacist with each prescription and refill. Be sure to read this information carefully each time. This drug comes with INSTRUCTIONS FOR USE. Ask your pharmacist for directions on how to use this drug. Read the information carefully. Talk to your pharmacist or health care provider if you have questions. Talk to your pediatrician regarding the use of this medicine in children. Special care may be needed. Overdosage: If you think you have taken too much of this medicine contact a poison control center or emergency room at once. NOTE: This medicine is only for you. Do not share this medicine with others. What if I miss a dose? If you miss a dose, take it as soon as you can within 5 days after the missed dose. Then take your next dose at your regular weekly time. If it has been longer than 5 days after the missed dose, do not take the missed dose. Take the next dose at your regular time. Do not take double or extra doses. If you have questions about a missed dose, contact your health care provider for advice. What may interact with this medicine?  other medicines for diabetes Many medications may cause changes in blood sugar, these include:  alcohol containing beverages  antiviral medicines for HIV or AIDS  aspirin and aspirin-like drugs  certain medicines for blood pressure, heart disease, irregular heart beat  chromium  diuretics  male hormones, such as estrogens or progestins,  birth control pills  fenofibrate  gemfibrozil  isoniazid  lanreotide  male hormones or anabolic steroids  MAOIs like Carbex, Eldepryl, Marplan, Nardil, and Parnate  medicines for weight loss  medicines for allergies, asthma, cold, or cough  medicines for depression, anxiety, or psychotic disturbances  niacin  nicotine  NSAIDs, medicines for pain and inflammation, like ibuprofen or  naproxen  octreotide  pasireotide  pentamidine  phenytoin  probenecid  quinolone antibiotics such as ciprofloxacin, levofloxacin, ofloxacin  some herbal dietary supplements  steroid medicines such as prednisone or cortisone  sulfamethoxazole; trimethoprim  thyroid hormones Some medications can hide the warning symptoms of low blood sugar (hypoglycemia). You may need to monitor your blood sugar more closely if you are taking one of these medications. These include:  beta-blockers, often used for high blood pressure or heart problems (examples include atenolol, metoprolol, propranolol)  clonidine  guanethidine  reserpine This list may not describe all possible interactions. Give your health care provider a list of all the medicines, herbs, non-prescription drugs, or dietary supplements you use. Also tell them if you smoke, drink alcohol, or use illegal drugs. Some items may interact with your medicine. What should I watch for while using this medicine? Visit your doctor or health care professional for regular checks on your progress. Drink plenty of fluids while taking this medicine. Check with your doctor or health care professional if you get an attack of severe diarrhea, nausea, and vomiting. The loss of too much body fluid can make it dangerous for you to take this medicine. A test called the HbA1C (A1C) will be monitored. This is a simple blood test. It measures your blood sugar control over the last 2 to 3 months. You will receive this test every 3 to 6 months. Learn how to check your blood sugar. Learn the symptoms of low and high blood sugar and how to manage them. Always carry a quick-source of sugar with you in case you have symptoms of low blood sugar. Examples include hard sugar candy or glucose tablets. Make sure others know that you can choke if you eat or drink when you develop serious symptoms of low blood sugar, such as seizures or unconsciousness. They must get  medical help at once. Tell your doctor or health care professional if you have high blood sugar. You might need to change the dose of your medicine. If you are sick or exercising more than usual, you might need to change the dose of your medicine. Do not skip meals. Ask your doctor or health care professional if you should avoid alcohol. Many nonprescription cough and cold products contain sugar or alcohol. These can affect blood sugar. Pens should never be shared. Even if the needle is changed, sharing may result in passing of viruses like hepatitis or HIV. Wear a medical ID bracelet or chain, and carry a card that describes your disease and details of your medicine and dosage times. Do not become pregnant while taking this medicine. Women should inform their doctor if they wish to become pregnant or think they might be pregnant. There is a potential for serious side effects to an unborn child. Talk to your health care professional or pharmacist for more information. What side effects may I notice from receiving this medicine? Side effects that you should report to your doctor or health care professional as soon as possible:  allergic reactions like skin rash, itching or hives, swelling of the face, lips, or tongue  breathing problems  changes in  vision  diarrhea that continues or is severe  lump or swelling on the neck  severe nausea  signs and symptoms of infection like fever or chills; cough; sore throat; pain or trouble passing urine  signs and symptoms of low blood sugar such as feeling anxious, confusion, dizziness, increased hunger, unusually weak or tired, sweating, shakiness, cold, irritable, headache, blurred vision, fast heartbeat, loss of consciousness  signs and symptoms of kidney injury like trouble passing urine or change in the amount of urine  trouble swallowing  unusual stomach upset or pain  vomiting Side effects that usually do not require medical attention  (report to your doctor or health care professional if they continue or are bothersome):  constipation  diarrhea  nausea  pain, redness, or irritation at site where injected  stomach upset This list may not describe all possible side effects. Call your doctor for medical advice about side effects. You may report side effects to FDA at 1-800-FDA-1088. Where should I keep my medicine? Keep out of the reach of children. Store unopened pens in a refrigerator between 2 and 8 degrees C (36 and 46 degrees F). Do not freeze. Protect from light and heat. After you first use the pen, it can be stored for 56 days at room temperature between 15 and 30 degrees C (59 and 86 degrees F) or in a refrigerator. Throw away your used pen after 56 days or after the expiration date, whichever comes first. Do not store your pen with the needle attached. If the needle is left on, medicine may leak from the pen. NOTE: This sheet is a summary. It may not cover all possible information. If you have questions about this medicine, talk to your doctor, pharmacist, or health care provider.  2021 Elsevier/Gold Standard (2019-05-21 09:41:51)

## 2020-11-25 NOTE — Progress Notes (Addendum)
Annual Exam   Chief Complaint:  Chief Complaint  Patient presents with   Annual Exam    History of Present Illness:  Eugene Mata is a 40 y.o. presents today for annual examination.     Nutrition/Lifestyle Diet: not great, sandwich, occasional soda Exercise: not currently He is not sexually active.    Social History   Tobacco Use  Smoking Status Every Day   Packs/day: 1.00   Years: 23.00   Pack years: 23.00   Types: Cigarettes  Smokeless Tobacco Former   Quit date: 1999   Social History   Substance and Sexual Activity  Alcohol Use Yes   Comment: rarely   Social History   Substance and Sexual Activity  Drug Use No     Safety The patient wears seatbelts: yes.     The patient feels safe at home and in their relationships: yes.  General Health Dentist in the last year: Yes Eye doctor: yes  Weight Wt Readings from Last 3 Encounters:  11/25/20 (!) 365 lb (165.6 kg)  03/30/20 (!) 367 lb (166.5 kg)  10/29/19 (!) 377 lb 8 oz (171.2 kg)   Patient has very high BMI  BMI Readings from Last 1 Encounters:  11/25/20 53.51 kg/m     Chronic disease screening Blood pressure monitoring:  BP Readings from Last 3 Encounters:  11/25/20 128/82  03/30/20 (!) 100/58  10/29/19 123/71    Lipid Monitoring: Indication for screening: age >35, obesity, diabetes, family hx, CV risk factors.  Lipid screening: Yes  Lab Results  Component Value Date   CHOL 194 11/25/2020   HDL 32.40 (L) 11/25/2020   LDLCALC 141 (H) 11/25/2020   TRIG 100.0 11/25/2020   CHOLHDL 6 11/25/2020     Diabetes Screening: age >3, overweight, family hx, PCOS, hx of gestational diabetes, at risk ethnicity, elevated blood pressure >135/80.  Diabetes Screening screening: Yes  Lab Results  Component Value Date   HGBA1C 5.6 03/30/2020      There is no immunization history on file for this patient.  Past Medical History:  Diagnosis Date   Hypertension    Sleep apnea    uses CPAP at  home    Past Surgical History:  Procedure Laterality Date   COLONOSCOPY WITH PROPOFOL N/A 03/08/2018   Procedure: COLONOSCOPY WITH PROPOFOL;  Surgeon: Christena Deem, MD;  Location: [facility] ENDOSCOPY;  Service: Endoscopy;  Laterality: N/A;   head cyst removal     WISDOM TOOTH EXTRACTION      Prior to Admission medications   Medication Sig Start Date End Date Taking? Authorizing Provider  lisinopril (ZESTRIL) 20 MG tablet Take 1 tablet (20 mg total) by mouth daily. 03/30/20   Lynnda Child, MD  UNABLE TO FIND CPAP pressure setting at 11    [provider]    No Known Allergies   Social History   Socioeconomic History   Marital status: Single    Spouse name: Not on file   Number of children: 0   Years of education: community college   Highest education level: Not on file  Occupational History   Not on file  Tobacco Use   Smoking status: Every Day    Packs/day: 1.00    Years: 23.00    Pack years: 23.00    Types: Cigarettes   Smokeless tobacco: Former    Quit date: 1999  Vaping Use   Vaping Use: Never used  Substance and Sexual Activity   Alcohol use: Yes  Comment: rarely   Drug use: No   Sexual activity: Not Currently  Other Topics Concern   Not on file  Social History Narrative   10/29/19   From: the area   Living: living alone   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      Family: mom is around and good relationship      Enjoys: not much      Exercise: not currently, has stationary bike   Diet: not Runner, broadcasting/film/video belts: Yes    Guns: Yes    Safe in relationships: Yes    Social Determinants of Corporate investment banker Strain: Not on file  Food Insecurity: Not on file  Transportation Needs: Not on file  Physical Activity: Not on file  Stress: Not on file  Social Connections: Not on file  Intimate Partner Violence: Not on file    Family History  Problem Relation Age of Onset   Cervical cancer  Mother    Colon cancer Sister 46       metastasized   Heart attack Other        thinks grandparents    Review of Systems  Constitutional:  Negative for chills and fever.  HENT:  Negative for congestion and sore throat.   Eyes:  Negative for blurred vision and double vision.  Respiratory:  Negative for shortness of breath.   Cardiovascular:  Negative for chest pain.  Gastrointestinal:  Negative for heartburn, nausea and vomiting.  Genitourinary: Negative.   Musculoskeletal: Negative.  Negative for myalgias.  Skin:  Negative for rash.  Neurological:  Negative for dizziness and headaches.  Endo/Heme/Allergies:  Does not bruise/bleed easily.  Psychiatric/Behavioral:  Negative for depression. The patient is not nervous/anxious.     Physical Exam BP 128/82 (BP Location: Right Arm, Patient Position: Sitting, Cuff Size: Large)   Pulse 91   Temp (!) 96.9 F (36.1 C) (Temporal)   Ht 5' 9.25" (1.759 m)   Wt (!) 365 lb (165.6 kg)   SpO2 96%   BMI 53.51 kg/m    BP Readings from Last 3 Encounters:  11/25/20 128/82  03/30/20 (!) 100/58  10/29/19 123/71      Physical Exam Constitutional:      General: He is not in acute distress.    Appearance: He is well-developed. He is obese. He is not diaphoretic.  HENT:     Head: Normocephalic and atraumatic.     Right Ear: Tympanic membrane and ear canal normal.     Left Ear: Tympanic membrane and ear canal normal.     Nose: Nose normal.     Mouth/Throat:     Mouth: Oropharynx is clear and moist.     Pharynx: Uvula midline.  Eyes:     General: No scleral icterus.    Extraocular Movements: EOM normal.     Conjunctiva/sclera: Conjunctivae normal.     Pupils: Pupils are equal, round, and reactive to light.  Cardiovascular:     Rate and Rhythm: Normal rate and regular rhythm.     Heart sounds: Normal heart sounds. No murmur heard. Pulmonary:     Effort: Pulmonary effort is normal. No respiratory distress.     Breath sounds: Normal  breath sounds. No wheezing.  Abdominal:     General: Bowel sounds are normal. There is no distension.     Palpations: Abdomen is soft. There is no mass.     Tenderness: There  is no abdominal tenderness. There is no guarding.  Musculoskeletal:        General: Normal range of motion.     Cervical back: Normal range of motion and neck supple.  Lymphadenopathy:     Cervical: No cervical adenopathy.  Skin:    General: Skin is warm and dry.     Capillary Refill: Capillary refill takes less than 2 seconds.  Neurological:     Mental Status: He is alert and oriented to person, place, and time.  Psychiatric:        Mood and Affect: Mood and affect normal.       Results:  PHQ-9:  Flowsheet Row Office Visit from 11/25/2020 in [facility] [place]  PHQ-9 Total Score 4       Depression screen [facility] 2/9 11/25/2020 10/29/2019  Decreased Interest 0 0  Down, Depressed, Hopeless 0 0  PHQ - 2 Score 0 0  Altered sleeping 1 -  Tired, decreased energy 1 -  Change in appetite 1 -  Feeling bad or failure about yourself  0 -  Trouble concentrating 1 -  Moving slowly or fidgety/restless 0 -  Suicidal thoughts 0 -  PHQ-9 Score 4 -      Assessment: 40 y.o. here for routine annual physical examination.  Plan: Problem List Items Addressed This Visit       Cardiovascular and Mediastinum   HTN (hypertension)    BP at goal. Cont lisinopril 20 mg      Relevant Orders   Basic metabolic panel (Completed)     Respiratory   Sleep apnea, obstructive    Using CPAP, working well at current settings. No concerns.       Other Visit Diagnoses     Annual physical exam    -  Primary   Mixed hyperlipidemia       Relevant Orders   Lipid panel (Completed)       Screening: -- Blood pressure screen  controlled -- cholesterol screening: will obtain -- Weight screening: obese: discussed management options, including lifestyle, dietary, and exercise. -- Diabetes Screening: will  obtain -- Nutrition: Encouraged healthy diet and exercise  The 10-year ASCVD risk score (Arnett DK, et al., 2019) is: 7.5%   Values used to calculate the score:     Age: 67 years     Sex: Male     Is Non-Hispanic African American: No     Diabetic: No     Tobacco smoker: Yes     Systolic Blood Pressure: 128 mmHg     Is BP treated: Yes     HDL Cholesterol: 32.4 mg/dL     Total Cholesterol: 194 mg/dL  -- Statin therapy for Age 43-75 with CVD risk >7.5%  Psych -- Depression screening (PHQ-9):  Flowsheet Row Office Visit from 11/25/2020 in [facility] [place]  PHQ-9 Total Score 4        Safety -- tobacco screening:  pt not interested in quitting -- alcohol screening:  low-risk usage. -- no evidence of domestic violence or intimate partner violence.   Cancer Screening -- up to date, family history due 2-3 years  Immunizations  There is no immunization history on file for this patient.  -- flu vaccine declined -- TDAP q10 years declined  -- PPSV-23 (19-64 with chronic disease or smoking) pt declined -- Covid-19 Vaccine - declined   Encouraged regular vision and dental screening. Encouraged healthy exercise and diet.   Lynnda Child

## 2021-01-28 DIAGNOSIS — G4733 Obstructive sleep apnea (adult) (pediatric): Secondary | ICD-10-CM | POA: Diagnosis not present

## 2021-04-27 ENCOUNTER — Encounter: Payer: Self-pay | Admitting: Family Medicine

## 2021-05-05 MED ORDER — OZEMPIC (0.25 OR 0.5 MG/DOSE) 2 MG/1.5ML ~~LOC~~ SOPN: 0.2500 mg | PEN_INJECTOR | SUBCUTANEOUS | 1 refills | Status: DC

## 2021-05-13 DIAGNOSIS — G4733 Obstructive sleep apnea (adult) (pediatric): Secondary | ICD-10-CM | POA: Diagnosis not present

## 2021-06-08 ENCOUNTER — Encounter: Payer: Self-pay | Admitting: Family Medicine

## 2021-06-08 DIAGNOSIS — G4733 Obstructive sleep apnea (adult) (pediatric): Secondary | ICD-10-CM

## 2021-06-09 NOTE — Telephone Encounter (Signed)
Community message sent to [facility] to inform them of order submitted.

## 2021-06-14 NOTE — Assessment & Plan Note (Signed)
Using CPAP, working well at current settings. No concerns.

## 2021-07-14 ENCOUNTER — Other Ambulatory Visit: Payer: Self-pay | Admitting: Family Medicine

## 2021-07-14 DIAGNOSIS — I10 Essential (primary) hypertension: Secondary | ICD-10-CM

## 2021-07-27 DIAGNOSIS — G4733 Obstructive sleep apnea (adult) (pediatric): Secondary | ICD-10-CM | POA: Diagnosis not present

## 2021-08-24 DIAGNOSIS — G4733 Obstructive sleep apnea (adult) (pediatric): Secondary | ICD-10-CM | POA: Diagnosis not present

## 2021-08-26 ENCOUNTER — Telehealth: Payer: Self-pay | Admitting: Family Medicine

## 2021-08-26 NOTE — Telephone Encounter (Addendum)
error 

## 2021-08-26 NOTE — Telephone Encounter (Incomplete Revision)
error 

## 2021-08-26 NOTE — Telephone Encounter (Signed)
#  [phone] 

## 2021-08-27 NOTE — Telephone Encounter (Signed)
Patient needs an appointment

## 2021-09-22 ENCOUNTER — Telehealth: Payer: BC Managed Care – PPO | Admitting: Nurse Practitioner

## 2021-09-22 DIAGNOSIS — J069 Acute upper respiratory infection, unspecified: Secondary | ICD-10-CM

## 2021-09-22 MED ORDER — FLUTICASONE PROPIONATE 50 MCG/ACT NA SUSP: 2.0000 | Freq: Every day | NASAL | 6 refills | Status: DC

## 2021-09-22 MED ORDER — CHLORPHEN-PE-ACETAMINOPHEN 4-10-325 MG PO TABS: 1.0000 | ORAL_TABLET | Freq: Four times a day (QID) | ORAL | 0 refills | Status: DC | PRN

## 2021-09-22 NOTE — Progress Notes (Signed)
Virtual Visit Consent   Eugene Mata, you are scheduled for a virtual visit with Mary-Margaret Daphine Deutscher, FNP, a [facility] provider, today.     Just as with appointments in the office, your consent must be obtained to participate.  Your consent will be active for this visit and any virtual visit you may have with one of our providers in the next 365 days.     If you have a MyChart account, a copy of this consent can be sent to you electronically.  All virtual visits are billed to your insurance company just like a traditional visit in the office.    As this is a virtual visit, video technology does not allow for your provider to perform a traditional examination.  This may limit your provider's ability to fully assess your condition.  If your provider identifies any concerns that need to be evaluated in person or the need to arrange testing (such as labs, EKG, etc.), we will make arrangements to do so.     Although advances in technology are sophisticated, we cannot ensure that it will always work on either your end or our end.  If the connection with a video visit is poor, the visit may have to be switched to a telephone visit.  With either a video or telephone visit, we are not always able to ensure that we have a secure connection.     I need to obtain your verbal consent now.   Are you willing to proceed with your visit today? YES   Eugene Mata has provided verbal consent on 09/22/2021 for a virtual visit (video or telephone).   Mary-Margaret Daphine Deutscher, FNP   Date: 09/22/2021 10:48 AM   Virtual Visit via Video Note   I, Mary-Margaret Daphine Deutscher, connected with Eugene Mata ([id number], 1980-12-21) on 09/22/21 at 11:15 AM EST by a video-enabled telemedicine application and verified that I am speaking with the correct person using two identifiers.  Location: Patient: Virtual Visit Location Patient: Home Provider: Virtual Visit Location Provider: Mobile   I discussed the limitations of  evaluation and management by telemedicine and the availability of in person appointments. The patient expressed understanding and agreed to proceed.    History of Present Illness: Eugene Mata is a 41 y.o. who identifies as a male who was assigned male at birth, and is being seen today for uri.  HPI: URI  This is a new problem. The current episode started in the past 7 days. The problem has been gradually worsening. The maximum temperature recorded prior to his arrival was 100.4 - 100.9 F. The fever has been present for 3 to 4 days. Associated symptoms include congestion, coughing, ear pain and rhinorrhea. Pertinent negatives include no sinus pain or sore throat. Treatments tried: nyquil and benadryl. The treatment provided mild relief.   Review of Systems  HENT:  Positive for congestion, ear pain and rhinorrhea. Negative for sinus pain and sore throat.   Respiratory:  Positive for cough.    Problems:  Patient Active Problem List   Diagnosis Date Noted   Sleep apnea, obstructive 10/29/2019   Morbid obesity (HCC) 10/29/2019   Tobacco use 10/29/2019   Family history of colon cancer 10/29/2019   Colon polyps 10/29/2019   HTN (hypertension) 09/17/2012    Allergies: No Known Allergies Medications:  Current Outpatient Medications:    lisinopril (ZESTRIL) 20 MG tablet, Take 1 tablet by mouth once daily, Disp: 90 tablet, Rfl: 3   OZEMPIC, 0.25  OR 0.5 MG/DOSE, 2 MG/1.5ML SOPN, INJECT 0.25MG  INTO THE SKIN ONCE PER WEEK, Disp: 2 mL, Rfl: 0   UNABLE TO FIND, CPAP pressure setting at 11, Disp: , Rfl:   Observations/Objective: Patient is well-developed, well-nourished in no acute distress.  Resting comfortably  at home.  Head is normocephalic, atraumatic.  No labored breathing.  Speech is clear and coherent with logical content.  Patient is alert and oriented at baseline.  Raspy voice  Wet cough noted Lots of sniffling  Assessment and Plan:  Eugene Mata in today with chief complaint of  URI   1. URI with cough and congestion 1. Take meds as prescribed 2. Use a cool mist humidifier especially during the winter months and when heat has been humid. 3. Use saline nose sprays frequently 4. Saline irrigations of the nose can be very helpful if done frequently.  * 4X daily for 1 week*  * Use of a nettie pot can be helpful with this. Follow directions with this* 5. Drink plenty of fluids 6. Keep thermostat turn down low 7.For any cough or congestion- OTC meds 8. For fever or aces or pains- take tylenol or ibuprofen appropriate for age and weight.  * for fevers greater than 101 orally you may alternate ibuprofen and tylenol every  3 hours.   Meds ordered this encounter  Medications   fluticasone (FLONASE) 50 MCG/ACT nasal spray    Sig: Place 2 sprays into both nostrils daily.    Dispense:  16 g    Refill:  6    Order Specific Question:   Supervising Provider    Answer:   Eber Hong [[id number]]   Chlorphen-PE-Acetaminophen 4-10-325 MG TABS    Sig: Take 1 tablet by mouth every 6 (six) hours as needed.    Dispense:  20 tablet    Refill:  0    Order Specific Question:   Supervising Provider    Answer:   Eber Hong [[id number]]       Follow Up Instructions: I discussed the assessment and treatment plan with the patient. The patient was provided an opportunity to ask questions and all were answered. The patient agreed with the plan and demonstrated an understanding of the instructions.  A copy of instructions were sent to the patient via MyChart.  The patient was advised to call back or seek an in-person evaluation if the symptoms worsen or if the condition fails to improve as anticipated.  Time:  I spent 8 minutes with the patient via telehealth technology discussing the above problems/concerns.    Mary-Margaret Daphine Deutscher, FNP

## 2021-09-22 NOTE — Patient Instructions (Signed)
1. Take meds as prescribed 2. Use a cool mist humidifier especially during the winter months and when heat has been humid. 3. Use saline nose sprays frequently 4. Saline irrigations of the nose can be very helpful if done frequently.  * 4X daily for 1 week*  * Use of a nettie pot can be helpful with this. Follow directions with this* 5. Drink plenty of fluids 6. Keep thermostat turn down low 7.For any cough or congestion- anything OTC is fine 8. For fever or aces or pains- take tylenol or ibuprofen appropriate for age and weight.  * for fevers greater than 101 orally you may alternate ibuprofen and tylenol every  3 hours.

## 2021-09-24 DIAGNOSIS — G4733 Obstructive sleep apnea (adult) (pediatric): Secondary | ICD-10-CM | POA: Diagnosis not present

## 2021-09-30 ENCOUNTER — Other Ambulatory Visit: Payer: Self-pay

## 2021-09-30 ENCOUNTER — Telehealth: Payer: Self-pay | Admitting: Family

## 2021-09-30 ENCOUNTER — Ambulatory Visit (INDEPENDENT_AMBULATORY_CARE_PROVIDER_SITE_OTHER): Payer: BC Managed Care – PPO | Admitting: Family

## 2021-09-30 ENCOUNTER — Encounter: Payer: Self-pay | Admitting: Family

## 2021-09-30 VITALS — BP 114/76 | HR 88 | Temp 97.0°F | Ht 68.0 in | Wt 363.0 lb

## 2021-09-30 DIAGNOSIS — G4733 Obstructive sleep apnea (adult) (pediatric): Secondary | ICD-10-CM | POA: Diagnosis not present

## 2021-09-30 DIAGNOSIS — J029 Acute pharyngitis, unspecified: Secondary | ICD-10-CM | POA: Diagnosis not present

## 2021-09-30 LAB — POCT GLYCOSYLATED HEMOGLOBIN (HGB A1C): Hemoglobin A1C: 5.5 % (ref 4.0–5.6)

## 2021-09-30 LAB — POCT RAPID STREP A (OFFICE): Rapid Strep A Screen: NEGATIVE

## 2021-09-30 MED ORDER — OZEMPIC (0.25 OR 0.5 MG/DOSE) 2 MG/1.5ML ~~LOC~~ SOPN: PEN_INJECTOR | SUBCUTANEOUS | 1 refills | Status: DC

## 2021-09-30 NOTE — Telephone Encounter (Signed)
Office notes faxed to [facility] supply. Fax confirmation received

## 2021-09-30 NOTE — Assessment & Plan Note (Signed)
Pt tolerating machine well, uses nightly . Uses on average >5 hours nightly. He has had fatigue improvement and is satisfactory to adherence of therapy.

## 2021-09-30 NOTE — Assessment & Plan Note (Signed)
Plan to restart semaglutide 0.25 once a week for the next 2 weeks and then increase to 0.5 mg once daily for the weeks thereafter.  Patient to follow-up in 6 weeks to reevaluate and consider 1 mg once weekly dosing.  Patient to work on exercise as tolerated as well and monitor food intake.

## 2021-09-30 NOTE — Patient Instructions (Signed)
I have sent a refill of your Ozempic to your pharmacy.  I want you to restart this at 0.25 mg once a week for the first 2 weeks and then increase to 0.5 mg thereafter once weekly as long as tolerating well.  Follow-up in the next 6 weeks and we will repeat your weight at the visit and see how you are doing, and at that point we can consider if you need increased dosage to 1 mg once weekly.  Work on a healthy diet and get back to exercising and working on walking throughout the week as you intend to do.  For your sore throat restart the Flonase as this seems to be helping, it is okay to start salt water gargles as necessary.  If there is no improvement please let me know.  Will be reaching out to [facility] and letting them know that you are compliant with your CPAP.  If you have any further issues with this please let me know.  It was a pleasure seeing you today! Please do not hesitate to reach out with any questions and or concerns.  Regards,   Eugenia Pancoast FNP-C

## 2021-09-30 NOTE — Telephone Encounter (Signed)
Can you send in copy of progress note from today to [facility] per their request? Completed note.

## 2021-09-30 NOTE — Progress Notes (Signed)
Established Patient Office Visit  Subjective:  Patient ID: Eugene LenisJason A Mata, male    DOB: Oct 26, 1980  Age: 41 y.o. MRN: [id number]  CC:  Chief Complaint  Patient presents with   Weight Loss    Pt wants to increase Ozempic.   Sleep Apnea    Needs a written rx to take to [facility] along with his auto titration information that he has.    HPI Eugene LenisJason A Wachsmuth is here today for follow up.   Sleep apnea: using cpap, working well at current settings/ auto titration. Going through [facility] for his CPAP. Back in 05/2021 he states he could tell his prior CPAP was getting older' because he felt increased fatigue throughout the day and not sleeping as well. Received new CPAP 12/6 and has been using since with improvement in fatigue as well as sleeping much better.   Per app, pt is using on average >5 hours each night and using every night. Good mask seal noted 100/100 and my airscore at current on app is 93/100.   Morbid Obesity: pt is taking semiglutide for this. Started back in August of 2022 , was injecting 2.5 once a week, and had about a three month supply. Ran out of refills back in December, and is here for follow up. He verbally states that after starting in August he lost about   Per his verbal recollection :  August 2022, 375 pounds  Nov 24th, 2022: 354 pounds  But since running out has started to gain weight back.  He was starting to walk throughout the week, but has since scaled back due to holiday schedule with work but plans to restart. He was tolerating the injection well, no GI upset or symptom concerns.   Wt Readings from Last 3 Encounters:  09/30/21 (!) 363 lb (164.7 kg)  11/25/20 (!) 365 lb (165.6 kg)  03/30/20 (!) 367 lb (166.5 kg)   Virtual visit 09/22/20 for URI, at that point had symptoms for the last seven days. C/o fever, congestion, coughing, ear pain and rhinorrhea. Was given cough tablets as well as flonase. Symptoms of cough and chest congestion improved however now with  sore throat, and mild cough. Ear pain is now resolved.   Past Medical History:  Diagnosis Date   Hypertension    Sleep apnea    uses CPAP at home    Past Surgical History:  Procedure Laterality Date   COLONOSCOPY WITH PROPOFOL N/A 03/08/2018   Procedure: COLONOSCOPY WITH PROPOFOL;  Surgeon: Christena DeemSkulskie, Martin U, MD;  [facility];  Service: Endoscopy;  Laterality: N/A;   head cyst removal     WISDOM TOOTH EXTRACTION      Family History  Problem Relation Age of Onset   Cervical cancer Mother    Colon cancer Sister [phone]       metastasized   Heart attack Other        thinks grandparents    Social History   Socioeconomic History   Marital status: Single    Spouse name: Not on file   Number of children: 0   Years of education: community college   Highest education level: Not on file  Occupational History   Not on file  Tobacco Use   Smoking status: Every Day    Packs/day: 1.00    Years: 23.00    Pack years: 23.00    Types: Cigarettes   Smokeless tobacco: Former    Quit date: 1999  Building services engineerVaping Use   Vaping  Use: Never used  Substance and Sexual Activity   Alcohol use: Yes    Comment: rarely   Drug use: No   Sexual activity: Not Currently  Other Topics Concern   Not on file  Social History Narrative   10/29/19   From: the area   Living: living alone   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      Family: mom is around and good relationship      Enjoys: not much      Exercise: not currently, has stationary bike   Diet: not Runner, broadcasting/film/video belts: Yes    Guns: Yes    Safe in relationships: Yes    Social Determinants of Corporate investment banker Strain: Not on file  Food Insecurity: Not on file  Transportation Needs: Not on file  Physical Activity: Not on file  Stress: Not on file  Social Connections: Not on file  Intimate Partner Violence: Not on file    Outpatient Medications Prior to Visit  Medication Sig  Dispense Refill   fluticasone (FLONASE) 50 MCG/ACT nasal spray Place 2 sprays into both nostrils daily. 16 g 6   lisinopril (ZESTRIL) 20 MG tablet Take 1 tablet by mouth once daily 90 tablet 3   UNABLE TO FIND CPAP pressure setting at 11     Chlorphen-PE-Acetaminophen 4-10-325 MG TABS Take 1 tablet by mouth every 6 (six) hours as needed. 20 tablet 0   OZEMPIC, 0.25 OR 0.5 MG/DOSE, 2 MG/1.5ML SOPN INJECT 0.25MG  INTO THE SKIN ONCE PER WEEK 2 mL 0   Respiratory Therapy Supplies (CARETOUCH CPAP & BIPAP HOSE) MISC 1 each by Does not apply route at bedtime. Pt uses CPAP once nightly     No facility-administered medications prior to visit.    No Known Allergies  ROS Review of Systems  Constitutional:  Positive for unexpected weight change (gaining weight again after stopping medication). Negative for fatigue and fever.  HENT:  Positive for congestion (slowly improving), postnasal drip and sore throat. Negative for ear pain, sinus pressure and sinus pain.   Respiratory:  Positive for cough (mild dry nonproductive, no chest congestion). Negative for shortness of breath and wheezing.   Cardiovascular:  Negative for chest pain, palpitations and leg swelling.  Gastrointestinal:  Negative for constipation and diarrhea.     Objective:    Physical Exam Constitutional:      General: He is not in acute distress.    Appearance: Normal appearance. He is obese. He is not ill-appearing, toxic-appearing or diaphoretic.  HENT:     Head: Normocephalic.     Right Ear: Tympanic membrane normal.     Left Ear: Tympanic membrane normal.     Nose: Nose normal.     Mouth/Throat:     Mouth: Mucous membranes are moist.     Pharynx: Posterior oropharyngeal erythema present.  Eyes:     Pupils: Pupils are equal, round, and reactive to light.  Cardiovascular:     Rate and Rhythm: Normal rate and regular rhythm.  Pulmonary:     Effort: Pulmonary effort is normal.     Breath sounds: Normal breath sounds.   Neurological:     General: No focal deficit present.     Mental Status: He is alert and oriented to person, place, and time.  Psychiatric:        Mood and Affect: Mood normal.        Behavior:  Behavior normal.        Thought Content: Thought content normal.        Judgment: Judgment normal.    BP 114/76 (BP Location: Left Arm, Patient Position: Sitting, Cuff Size: Large)    Pulse 88    Temp (!) 97 F (36.1 C)    Ht 5\' 8"  (1.727 m) Comment: per pt   Wt (!) 363 lb (164.7 kg)    SpO2 97%    BMI 55.19 kg/m  Wt Readings from Last 3 Encounters:  09/30/21 (!) 363 lb (164.7 kg)  11/25/20 (!) 365 lb (165.6 kg)  03/30/20 (!) 367 lb (166.5 kg)     Health Maintenance Due  Topic Date Due   Pneumococcal Vaccine 57-57 Years old (1 - PCV) Never done   TETANUS/TDAP  Never done   INFLUENZA VACCINE  Never done    There are no preventive care reminders to display for this patient.  No results found for: TSH No results found for: WBC, HGB, HCT, MCV, PLT Lab Results  Component Value Date   NA 136 11/25/2020   K 4.0 11/25/2020   CO2 24 11/25/2020   GLUCOSE 97 11/25/2020   BUN 9 11/25/2020   CREATININE 0.81 11/25/2020   BILITOT 0.4 03/30/2020   ALKPHOS 55 03/30/2020   AST 19 03/30/2020   ALT 27 03/30/2020   PROT 7.2 03/30/2020   ALBUMIN 4.0 03/30/2020   CALCIUM 9.3 11/25/2020   GFR 111.01 11/25/2020   Lab Results  Component Value Date   HGBA1C 5.5 09/30/2021      Assessment & Plan:   Problem List Items Addressed This Visit       Respiratory   Sleep apnea, obstructive    Pt tolerating machine well, uses nightly . Uses on average >5 hours nightly. He has had fatigue improvement and is satisfactory to adherence of therapy.       Relevant Orders   POCT glycosylated hemoglobin (Hb A1C) (Completed)     Other   Morbid obesity (HCC)    Plan to restart semaglutide 0.25 once a week for the next 2 weeks and then increase to 0.5 mg once daily for the weeks thereafter.  Patient to  follow-up in 6 weeks to reevaluate and consider 1 mg once weekly dosing.  Patient to work on exercise as tolerated as well and monitor food intake.      Relevant Medications   Semaglutide,0.25 or 0.5MG /DOS, (OZEMPIC, 0.25 OR 0.5 MG/DOSE,) 2 MG/1.5ML SOPN   Other Visit Diagnoses     Sore throat    -  Primary   Relevant Orders   POCT rapid strep A (Completed)       Meds ordered this encounter  Medications   Semaglutide,0.25 or 0.5MG /DOS, (OZEMPIC, 0.25 OR 0.5 MG/DOSE,) 2 MG/1.5ML SOPN    Sig: Inject [place] 0.25 mg once a week for two weeks, then increase to 0.5 mg weekly therafter    Dispense:  1.5 mL    Refill:  1    Order Specific Question:   Supervising Provider    Answer:   BEDSOLE, AMY E [[id number]]    Follow-up: Return in about 6 weeks (around 11/11/2021) for follow up on weight management.    11/13/2021, FNP

## 2021-10-25 DIAGNOSIS — G4733 Obstructive sleep apnea (adult) (pediatric): Secondary | ICD-10-CM | POA: Diagnosis not present

## 2021-11-11 ENCOUNTER — Ambulatory Visit: Payer: BC Managed Care – PPO | Admitting: Family

## 2021-11-22 DIAGNOSIS — G4733 Obstructive sleep apnea (adult) (pediatric): Secondary | ICD-10-CM | POA: Diagnosis not present

## 2021-11-29 ENCOUNTER — Ambulatory Visit (INDEPENDENT_AMBULATORY_CARE_PROVIDER_SITE_OTHER): Payer: BC Managed Care – PPO | Admitting: Family Medicine

## 2021-11-29 ENCOUNTER — Other Ambulatory Visit: Payer: Self-pay

## 2021-11-29 DIAGNOSIS — I10 Essential (primary) hypertension: Secondary | ICD-10-CM

## 2021-11-29 DIAGNOSIS — Z0000 Encounter for general adult medical examination without abnormal findings: Secondary | ICD-10-CM

## 2021-11-29 DIAGNOSIS — Z720 Tobacco use: Secondary | ICD-10-CM | POA: Diagnosis not present

## 2021-11-29 LAB — COMPREHENSIVE METABOLIC PANEL
ALT: 29 U/L (ref 0–53)
AST: 19 U/L (ref 0–37)
Albumin: 4 g/dL (ref 3.5–5.2)
Alkaline Phosphatase: 53 U/L (ref 39–117)
BUN: 8 mg/dL (ref 6–23)
CO2: 26 mEq/L (ref 19–32)
Calcium: 9.2 mg/dL (ref 8.4–10.5)
Chloride: 104 mEq/L (ref 96–112)
Creatinine, Ser: 0.78 mg/dL (ref 0.40–1.50)
GFR: 111.49 mL/min (ref >60.00)
Glucose, Bld: 92 mg/dL (ref 70–99)
Potassium: 3.7 mEq/L (ref 3.5–5.1)
Sodium: 137 mEq/L (ref 135–145)
Total Bilirubin: 0.4 mg/dL (ref 0.2–1.2)
Total Protein: 7.1 g/dL (ref 6.0–8.3)

## 2021-11-29 LAB — LIPID PANEL
Cholesterol: 180 mg/dL (ref 0–200)
HDL: 33.7 mg/dL — ABNORMAL LOW (ref >39.00)
LDL Cholesterol: 123 mg/dL — ABNORMAL HIGH (ref 0–99)
NonHDL: 146.78
Total CHOL/HDL Ratio: 5
Triglycerides: 120 mg/dL (ref 0.0–149.0)
VLDL: 24 mg/dL (ref 0.0–40.0)

## 2021-11-29 LAB — CBC
HCT: 43.7 % (ref 39.0–52.0)
Hemoglobin: 14.8 g/dL (ref 13.0–17.0)
MCHC: 33.9 g/dL (ref 30.0–36.0)
MCV: 92.6 fl (ref 78.0–100.0)
Platelets: 324 10*3/uL (ref 150.0–400.0)
RBC: 4.72 Mil/uL (ref 4.22–5.81)
RDW: 14.6 % (ref 11.5–15.5)
WBC: 9.6 10*3/uL (ref 4.0–10.5)

## 2021-11-29 LAB — HEMOGLOBIN A1C: Hgb A1c MFr Bld: 5.7 % (ref 4.6–6.5)

## 2021-11-29 MED ORDER — SEMAGLUTIDE (1 MG/DOSE) 4 MG/3ML ~~LOC~~ SOPN: 1.0000 mg | PEN_INJECTOR | SUBCUTANEOUS | 0 refills | Status: DC

## 2021-11-29 NOTE — Assessment & Plan Note (Signed)
Not interested in quitting at this time.  Encourage cessation. ?

## 2021-11-29 NOTE — Patient Instructions (Addendum)
Ozempic ?- continue 1 mg ?- update in 2-3 weeks if tolerating and can send in 2 mg dose ?- return in 3 months ? ?Consider getting updated Tetanus shot ?

## 2021-11-29 NOTE — Progress Notes (Signed)
Annual Exam   Chief Complaint:  Chief Complaint  Patient presents with   Annual Exam    No concerns     History of Present Illness:  Eugene LenisJason A Laroque is a 41 y.o. presents today for annual examination.    #ozempic - has been taking 0.5 mg dosage - tolerating this fine   Nutrition/Lifestyle Diet: trying to reduce calories, appetite is suppressed with medication Exercise: not as active over the last month, has been working He is not currently sexually active Any issues getting or maintaining an erection? no  Social History   Tobacco Use  Smoking Status Every Day   Packs/day: 1.00   Years: 23.00   Pack years: 23.00   Types: Cigarettes  Smokeless Tobacco Former   Quit date: 1999   Social History   Substance and Sexual Activity  Alcohol Use Yes   Comment: rarely   Social History   Substance and Sexual Activity  Drug Use No     Safety The patient wears seatbelts: yes.     The patient feels safe at home and in their relationships: yes.  General Health Dentist in the last year: Yes Eye doctor: no  Weight Wt Readings from Last 3 Encounters:  11/29/21 (!) 360 lb 2 oz (163.4 kg)  09/30/21 (!) 363 lb (164.7 kg)  11/25/20 (!) 365 lb (165.6 kg)   Patient has very high BMI  BMI Readings from Last 1 Encounters:  11/29/21 53.18 kg/m     Chronic disease screening Blood pressure monitoring:  BP Readings from Last 3 Encounters:  11/29/21 130/76  09/30/21 114/76  11/25/20 128/82    Lipid Monitoring: Indication for screening: age >35, obesity, diabetes, family hx, CV risk factors.  Lipid screening: Yes  Lab Results  Component Value Date   CHOL 194 11/25/2020   HDL 32.40 (L) 11/25/2020   LDLCALC 141 (H) 11/25/2020   TRIG 100.0 11/25/2020   CHOLHDL 6 11/25/2020     Diabetes Screening: age 55>40, overweight, family hx, PCOS, hx of gestational diabetes, at risk ethnicity, elevated blood pressure >135/80.  Diabetes Screening screening: Yes  Lab Results   Component Value Date   HGBA1C 5.5 09/30/2021      There is no immunization history on file for this patient.  Past Medical History:  Diagnosis Date   Hypertension    Sleep apnea    uses CPAP at home    Past Surgical History:  Procedure Laterality Date   COLONOSCOPY WITH PROPOFOL N/A 03/08/2018   Procedure: COLONOSCOPY WITH PROPOFOL;  Surgeon: Christena DeemSkulskie, Martin U, MD;  [facility];  Service: Endoscopy;  Laterality: N/A;   head cyst removal     WISDOM TOOTH EXTRACTION      Prior to Admission medications   Medication Sig Start Date End Date Taking? Authorizing Provider  lisinopril (ZESTRIL) 20 MG tablet Take 1 tablet by mouth once daily 07/16/21  Yes Danija Gosa, Chryl HeckJessica R, MD  UNABLE TO FIND CPAP pressure setting at 11   Yes [provider]  Semaglutide,0.25 or 0.5MG /DOS, (OZEMPIC, 0.25 OR 0.5 MG/DOSE,) 2 MG/1.5ML SOPN Inject [place] 0.25 mg once a week for two weeks, then increase to 0.5 mg weekly therafter Patient not taking: Reported on 11/29/2021 09/30/21   Mort Sawyersugal, Tabitha, FNP    No Known Allergies   Social History   Socioeconomic History   Marital status: Single    Spouse name: Not on file   Number of children: 0   Years of education: community college  Highest education level: Not on file  Occupational History   Not on file  Tobacco Use   Smoking status: Every Day    Packs/day: 1.00    Years: 23.00    Pack years: 23.00    Types: Cigarettes   Smokeless tobacco: Former    Quit date: 1999  Vaping Use   Vaping Use: Never used  Substance and Sexual Activity   Alcohol use: Yes    Comment: rarely   Drug use: No   Sexual activity: Not Currently  Other Topics Concern   Not on file  Social History Narrative   10/29/19   From: the area   Living: living alone   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      Family: mom is around and good relationship      Enjoys: not much      Exercise: not currently, has stationary bike    Diet: not Product/process development scientist   Seat belts: Yes    Guns: Yes    Safe in relationships: Yes    Social Determinants of Corporate investment banker Strain: Not on file  Food Insecurity: Not on file  Transportation Needs: Not on file  Physical Activity: Not on file  Stress: Not on file  Social Connections: Not on file  Intimate Partner Violence: Not on file    Family History  Problem Relation Age of Onset   Cervical cancer Mother    Colon cancer Sister 65       metastasized   Heart attack Other        thinks grandparents    Review of Systems  Constitutional:  Negative for chills and fever.  HENT:  Negative for congestion and sore throat.   Eyes:  Negative for blurred vision and double vision.  Respiratory:  Negative for shortness of breath.   Cardiovascular:  Negative for chest pain.  Gastrointestinal:  Negative for heartburn, nausea and vomiting.  Genitourinary: Negative.   Musculoskeletal: Negative.  Negative for myalgias.  Skin:  Negative for rash.  Neurological:  Negative for dizziness and headaches.  Endo/Heme/Allergies:  Does not bruise/bleed easily.  Psychiatric/Behavioral:  Negative for depression. The patient is not nervous/anxious.     Physical Exam BP 130/76    Pulse 95    Temp 98.2 F (36.8 C) (Oral)    Ht  (1.753 m)    Wt (!) 360 lb 2 oz (163.4 kg)    SpO2 97%    BMI 53.18 kg/m    BP Readings from Last 3 Encounters:  11/29/21 130/76  09/30/21 114/76  11/25/20 128/82      Physical Exam Constitutional:      General: He is not in acute distress.    Appearance: He is well-developed. He is obese. He is not diaphoretic.  HENT:     Head: Normocephalic and atraumatic.     Right Ear: Tympanic membrane and ear canal normal.     Left Ear: Tympanic membrane and ear canal normal.     Nose: Nose normal.     Mouth/Throat:     Pharynx: Uvula midline.  Eyes:     General: No scleral icterus.    Conjunctiva/sclera: Conjunctivae normal.     Pupils: Pupils  are equal, round, and reactive to light.  Cardiovascular:     Rate and Rhythm: Normal rate and regular rhythm.     Heart sounds: Normal heart sounds. No murmur heard. Pulmonary:  Effort: Pulmonary effort is normal. No respiratory distress.     Breath sounds: Normal breath sounds. No wheezing.  Abdominal:     General: Bowel sounds are normal. There is no distension.     Palpations: Abdomen is soft. There is no mass.     Tenderness: There is no abdominal tenderness. There is no guarding.  Musculoskeletal:        General: Normal range of motion.     Cervical back: Normal range of motion and neck supple.  Lymphadenopathy:     Cervical: No cervical adenopathy.  Skin:    General: Skin is warm and dry.     Capillary Refill: Capillary refill takes less than 2 seconds.  Neurological:     Mental Status: He is alert and oriented to person, place, and time.       Results:  PHQ-9:  Flowsheet Row Office Visit from 11/25/2020 in [facility] [place]  PHQ-9 Total Score 4         Assessment: 41 y.o. here for routine annual physical examination.  Plan: Problem List Items Addressed This Visit       Cardiovascular and Mediastinum   HTN (hypertension)    Blood pressure controlled, continue lisinopril.        Other   Morbid obesity (HCC) - Primary    He has had a little bit of weight loss on low-dose Ozempic, he is tolerating well.  Increase to 1 mg, update in 3 weeks with and will plan to increase to 2 mg if tolerating.  Turn in 3 months      Relevant Medications   Semaglutide, 1 MG/DOSE, 4 MG/3ML SOPN   Other Relevant Orders   Comprehensive metabolic panel   Hemoglobin A1c   Lipid panel   Tobacco use    Not interested in quitting at this time.  Encourage cessation.      Other Visit Diagnoses     Annual physical exam       Relevant Orders   Comprehensive metabolic panel   Hemoglobin A1c   Lipid panel   CBC       Screening: -- Blood pressure  screen  see above -- cholesterol screening: will obtain -- Weight screening: obese: discussed management options, including lifestyle, dietary, and exercise. -- Diabetes Screening: will obtain -- Nutrition: Encouraged healthy diet and exercise  The 10-year ASCVD risk score (Arnett DK, et al., 2019) is: 7.8%   Values used to calculate the score:     Age: 41 years     Sex: Male     Is Non-Hispanic African American: No     Diabetic: No     Tobacco smoker: Yes     Systolic Blood Pressure: 130 mmHg     Is BP treated: Yes     HDL Cholesterol: 32.4 mg/dL     Total Cholesterol: 194 mg/dL  -- Statin therapy for Age 55-75 with CVD risk >7.5%  Psych -- Depression screening (PHQ-9):  Flowsheet Row Office Visit from 11/25/2020 in [facility] [place]  PHQ-9 Total Score 4        Safety -- tobacco screening:  declined intervention -- alcohol screening:  low-risk usage. -- no evidence of domestic violence or intimate partner violence.   Cancer Screening -- No age related cancer screening due  Immunizations  There is no immunization history on file for this patient.  -- flu vaccine not up to date - declined -- TDAP q10 years not up to date -  declined  Encouraged regular vision and dental screening. Encouraged healthy exercise and diet.   Lynnda Child

## 2021-11-29 NOTE — Assessment & Plan Note (Signed)
He has had a little bit of weight loss on low-dose Ozempic, he is tolerating well.  Increase to 1 mg, update in 3 weeks with and will plan to increase to 2 mg if tolerating.  Turn in 3 months ?

## 2021-11-29 NOTE — Assessment & Plan Note (Signed)
Blood pressure controlled, continue lisinopril

## 2021-12-15 ENCOUNTER — Encounter: Payer: Self-pay | Admitting: Family Medicine

## 2021-12-29 ENCOUNTER — Other Ambulatory Visit: Payer: Self-pay

## 2021-12-29 MED ORDER — SEMAGLUTIDE (1 MG/DOSE) 4 MG/3ML ~~LOC~~ SOPN: 1.0000 mg | PEN_INJECTOR | SUBCUTANEOUS | 0 refills | Status: DC

## 2021-12-29 NOTE — Telephone Encounter (Signed)
I refilled at same dose based on his previous message.  ?

## 2022-01-13 MED ORDER — OZEMPIC (0.25 OR 0.5 MG/DOSE) 2 MG/1.5ML ~~LOC~~ SOPN: 0.5000 mg | PEN_INJECTOR | SUBCUTANEOUS | 1 refills | Status: DC

## 2022-01-13 NOTE — Addendum Note (Signed)
Addended by: Gweneth Dimitri R on: 01/13/2022 01:29 PM ? ? Modules accepted: Orders ? ?

## 2022-01-26 MED ORDER — SEMAGLUTIDE (2 MG/DOSE) 8 MG/3ML ~~LOC~~ SOPN: 2.0000 mg | PEN_INJECTOR | SUBCUTANEOUS | 0 refills | Status: DC

## 2022-01-26 NOTE — Addendum Note (Signed)
Addended by: Waunita Schooner R on: 01/26/2022 01:46 PM ? ? Modules accepted: Orders ? ?

## 2022-02-07 ENCOUNTER — Ambulatory Visit (INDEPENDENT_AMBULATORY_CARE_PROVIDER_SITE_OTHER): Payer: BC Managed Care – PPO | Admitting: Family Medicine

## 2022-02-07 DIAGNOSIS — R7303 Prediabetes: Secondary | ICD-10-CM

## 2022-02-07 DIAGNOSIS — I10 Essential (primary) hypertension: Secondary | ICD-10-CM

## 2022-02-07 DIAGNOSIS — G4733 Obstructive sleep apnea (adult) (pediatric): Secondary | ICD-10-CM | POA: Diagnosis not present

## 2022-02-07 MED ORDER — SEMAGLUTIDE (2 MG/DOSE) 8 MG/3ML ~~LOC~~ SOPN: 2.0000 mg | PEN_INJECTOR | SUBCUTANEOUS | 0 refills | Status: DC

## 2022-02-07 NOTE — Assessment & Plan Note (Signed)
<  5% body weight loss in 4 months of close adherence. Discussed goal of 5% or more over the next 3 months and importance of good diet and exercise.  He will work on [employer], [employer] provided.  He is also going to start exercising.  We will follow-up in 3 months continue Ozempic 2 mg weekly.  If he is unable to lose 5% of his body weight, we will discuss tapering Ozempic dose.

## 2022-02-07 NOTE — Patient Instructions (Addendum)
Continue ozempic Start exercising - goal is 20-30 minutes of cardio 5-7 days a week  Work on healthy food choices   DASH Eating Plan DASH stands for Dietary Approaches to Stop Hypertension. The DASH eating plan is a healthy eating plan that has been shown to: Reduce high blood pressure (hypertension). Reduce your risk for type 2 diabetes, heart disease, and stroke. Help with weight loss. What are tips for following this plan? Reading food labels Check food labels for the amount of salt (sodium) per serving. Choose foods with less than 5 percent of the Daily Value of sodium. Generally, foods with less than 300 milligrams (mg) of sodium per serving fit into this eating plan. To find whole grains, look for the word "whole" as the first word in the ingredient list. Shopping Buy products labeled as "low-sodium" or "no salt added." Buy fresh foods. Avoid canned foods and pre-made or frozen meals. Cooking Avoid adding salt when cooking. Use salt-free seasonings or herbs instead of table salt or sea salt. Check with your health care provider or pharmacist before using salt substitutes. Do not fry foods. Cook foods using healthy methods such as baking, boiling, grilling, roasting, and broiling instead. Cook with heart-healthy oils, such as olive, canola, avocado, soybean, or sunflower oil. Meal planning  Eat a balanced diet that includes: 4 or more servings of fruits and 4 or more servings of vegetables each day. Try to fill one-half of your plate with fruits and vegetables. 6-8 servings of whole grains each day. Less than 6 oz (170 g) of lean meat, poultry, or fish each day. A 3-oz (85-g) serving of meat is about the same size as a deck of cards. One egg equals 1 oz (28 g). 2-3 servings of low-fat dairy each day. One serving is 1 cup (237 mL). 1 serving of nuts, seeds, or beans 5 times each week. 2-3 servings of heart-healthy fats. Healthy fats called omega-3 fatty acids are found in foods such  as walnuts, flaxseeds, fortified milks, and eggs. These fats are also found in cold-water fish, such as sardines, salmon, and mackerel. Limit how much you eat of: Canned or prepackaged foods. Food that is high in trans fat, such as some fried foods. Food that is high in saturated fat, such as fatty meat. Desserts and other sweets, sugary drinks, and other foods with added sugar. Full-fat dairy products. Do not salt foods before eating. Do not eat more than 4 egg yolks a week. Try to eat at least 2 vegetarian meals a week. Eat more home-cooked food and less restaurant, buffet, and fast food. Lifestyle When eating at a restaurant, ask that your food be prepared with less salt or no salt, if possible. If you drink alcohol: Limit how much you use to: 0-1 drink a day for women who are not pregnant. 0-2 drinks a day for men. Be aware of how much alcohol is in your drink. In the [place] one drink equals one 12 oz bottle of beer (355 mL), one 5 oz glass of wine (148 mL), or one 1 oz glass of hard liquor (44 mL). General information Avoid eating more than 2,300 mg of salt a day. If you have hypertension, you may need to reduce your sodium intake to 1,500 mg a day. Work with your health care provider to maintain a healthy body weight or to lose weight. Ask what an ideal weight is for you. Get at least 30 minutes of exercise that causes your heart to beat faster (  aerobic exercise) most days of the week. Activities may include walking, swimming, or biking. Work with your health care provider or dietitian to adjust your eating plan to your individual calorie needs. What foods should I eat? Fruits All fresh, dried, or frozen fruit. Canned fruit in natural juice (without added sugar). Vegetables Fresh or frozen vegetables (raw, steamed, roasted, or grilled). Low-sodium or reduced-sodium tomato and vegetable juice. Low-sodium or reduced-sodium tomato sauce and tomato paste. Low-sodium or reduced-sodium  canned vegetables. Grains Whole-grain or whole-wheat bread. Whole-grain or whole-wheat pasta. Brown rice. Eugene Mata. Bulgur. Whole-grain and low-sodium cereals. Pita bread. Low-fat, low-sodium crackers. Whole-wheat flour tortillas. Meats and other proteins Skinless chicken or [place]. Ground chicken or [place]. Pork with fat trimmed off. Fish and seafood. Egg whites. Dried beans, peas, or lentils. Unsalted nuts, nut butters, and seeds. Unsalted canned beans. Lean cuts of beef with fat trimmed off. Low-sodium, lean precooked or cured meat, such as sausages or meat loaves. Dairy Low-fat (1%) or fat-free (skim) milk. Reduced-fat, low-fat, or fat-free cheeses. Nonfat, low-sodium ricotta or cottage cheese. Low-fat or nonfat yogurt. Low-fat, low-sodium cheese. Fats and oils Soft margarine without trans fats. Vegetable oil. Reduced-fat, low-fat, or light mayonnaise and salad dressings (reduced-sodium). Canola, safflower, olive, avocado, soybean, and sunflower oils. Avocado. Seasonings and condiments Herbs. Spices. Seasoning mixes without salt. Other foods Unsalted popcorn and pretzels. Fat-free sweets. The items listed above may not be a complete list of foods and beverages you can eat. Contact a dietitian for more information. What foods should I avoid? Fruits Canned fruit in a light or heavy syrup. Fried fruit. Fruit in cream or butter sauce. Vegetables Creamed or fried vegetables. Vegetables in a cheese sauce. Regular canned vegetables (not low-sodium or reduced-sodium). Regular canned tomato sauce and paste (not low-sodium or reduced-sodium). Regular tomato and vegetable juice (not low-sodium or reduced-sodium). Eugene Mata. Olives. Grains Baked goods made with fat, such as croissants, muffins, or some breads. Dry pasta or rice meal packs. Meats and other proteins Fatty cuts of meat. Ribs. Fried meat. Eugene Mata. Bologna, salami, and other precooked or cured meats, such as sausages or meat loaves. Fat  from the back of a pig (fatback). Bratwurst. Salted nuts and seeds. Canned beans with added salt. Canned or smoked fish. Whole eggs or egg yolks. Chicken or [place] with skin. Dairy Whole or 2% milk, cream, and half-and-half. Whole or full-fat cream cheese. Whole-fat or sweetened yogurt. Full-fat cheese. Nondairy creamers. Whipped toppings. Processed cheese and cheese spreads. Fats and oils Butter. Stick margarine. Lard. Shortening. Ghee. Bacon fat. Tropical oils, such as coconut, palm kernel, or palm oil. Seasonings and condiments Onion salt, garlic salt, seasoned salt, table salt, and sea salt. Worcestershire sauce. Tartar sauce. Barbecue sauce. Teriyaki sauce. Soy sauce, including reduced-sodium. Steak sauce. Canned and packaged gravies. Fish sauce. Oyster sauce. Cocktail sauce. Store-bought horseradish. Ketchup. Mustard. Meat flavorings and tenderizers. Bouillon cubes. Hot sauces. Pre-made or packaged marinades. Pre-made or packaged taco seasonings. Relishes. Regular salad dressings. Other foods Salted popcorn and pretzels. The items listed above may not be a complete list of foods and beverages you should avoid. Contact a dietitian for more information. Where to find more information National Heart, Lung, and Blood Institute: https://wilson-eaton.com/ American Heart Association: www.heart.org Academy of Nutrition and Dietetics: www.eatright.[place]: www.kidney.org Summary The DASH eating plan is a healthy eating plan that has been shown to reduce high blood pressure (hypertension). It may also reduce your risk for type 2 diabetes, heart disease, and stroke. When on the  DASH eating plan, aim to eat more fresh fruits and vegetables, whole grains, lean proteins, low-fat dairy, and heart-healthy fats. With the DASH eating plan, you should limit salt (sodium) intake to 2,300 mg a day. If you have hypertension, you may need to reduce your sodium intake to 1,500 mg a day. Work with  your health care provider or dietitian to adjust your eating plan to your individual calorie needs. This information is not intended to replace advice given to you by your health care provider. Make sure you discuss any questions you have with your health care provider. Document Revised: 08/09/2019 Document Reviewed: 08/09/2019 Elsevier Patient Education  [place].

## 2022-02-07 NOTE — Assessment & Plan Note (Signed)
Continue with weight loss and Ozempic.

## 2022-02-07 NOTE — Assessment & Plan Note (Signed)
Adherent to cpap. Cont using

## 2022-02-07 NOTE — Progress Notes (Signed)
Subjective:     Eugene Mata is a 41 y.o. male presenting for Follow-up (Obesity )     HPI  #obesity - taking ozempic  - started 374 lbs and ran out of medication - Saw Tabitha in Jan 2023 and weighed 363 - not doing well with diet and exercise - feels he is not eating that much - but not eating anything healthy  Review of Systems   Social History   Tobacco Use  Smoking Status Every Day   Packs/day: 1.00   Years: 23.00   Pack years: 23.00   Types: Cigarettes  Smokeless Tobacco Former   Quit date: 1999        Objective:    BP Readings from Last 3 Encounters:  02/07/22 118/70  11/29/21 130/76  09/30/21 114/76   Wt Readings from Last 3 Encounters:  02/07/22 (!) 351 lb 3 oz (159.3 kg)  11/29/21 (!) 360 lb 2 oz (163.4 kg)  09/30/21 (!) 363 lb (164.7 kg)    BP 118/70   Pulse 85   Temp (!) 97.2 F (36.2 C) (Temporal)   Ht 5\' 9"  (1.753 m)   Wt (!) 351 lb 3 oz (159.3 kg)   SpO2 95%   BMI 51.86 kg/m    Physical Exam Constitutional:      Appearance: Normal appearance. He is not ill-appearing or diaphoretic.  HENT:     Right Ear: External ear normal.     Left Ear: External ear normal.     Nose: Nose normal.  Eyes:     General: No scleral icterus.    Extraocular Movements: Extraocular movements intact.     Conjunctiva/sclera: Conjunctivae normal.  Cardiovascular:     Rate and Rhythm: Normal rate and regular rhythm.     Heart sounds: No murmur heard. Pulmonary:     Effort: Pulmonary effort is normal. No respiratory distress.     Breath sounds: Normal breath sounds. No wheezing.  Musculoskeletal:     Cervical back: Neck supple.  Skin:    General: Skin is warm and dry.  Neurological:     Mental Status: He is alert. Mental status is at baseline.  Psychiatric:        Mood and Affect: Mood normal.          Assessment & Plan:   Problem List Items Addressed This Visit       Cardiovascular and Mediastinum   HTN (hypertension)    BP at  goal. Cont lisinopril 20 mg         Respiratory   Sleep apnea, obstructive    Adherent to cpap. Cont using         Other   Morbid obesity (HCC) - Primary    <5% body weight loss in 4 months of close adherence. Discussed goal of 5% or more over the next 3 months and importance of good diet and exercise.  He will work on , [employer] provided.  He is also going to start exercising.  We will follow-up in 3 months continue Ozempic 2 mg weekly.  If he is unable to lose 5% of his body weight, we will discuss tapering Ozempic dose.       Relevant Medications   Semaglutide, 2 MG/DOSE, 8 MG/3ML SOPN   Prediabetes    Continue with weight loss and Ozempic.         Return in about 3 months (around 05/10/2022) for weight loss.  05/12/2022, MD

## 2022-02-07 NOTE — Assessment & Plan Note (Signed)
BP at goal. Cont lisinopril 20 mg 

## 2022-04-26 ENCOUNTER — Encounter: Payer: Self-pay | Admitting: Family Medicine

## 2022-04-27 NOTE — Telephone Encounter (Signed)
Appt cancelled

## 2022-05-05 DIAGNOSIS — G4733 Obstructive sleep apnea (adult) (pediatric): Secondary | ICD-10-CM | POA: Diagnosis not present

## 2022-05-10 ENCOUNTER — Ambulatory Visit: Payer: BC Managed Care – PPO | Admitting: Family Medicine

## 2022-05-20 DIAGNOSIS — G4733 Obstructive sleep apnea (adult) (pediatric): Secondary | ICD-10-CM | POA: Diagnosis not present

## 2022-06-05 DIAGNOSIS — G4733 Obstructive sleep apnea (adult) (pediatric): Secondary | ICD-10-CM | POA: Diagnosis not present

## 2022-07-05 DIAGNOSIS — G4733 Obstructive sleep apnea (adult) (pediatric): Secondary | ICD-10-CM | POA: Diagnosis not present

## 2022-07-21 ENCOUNTER — Other Ambulatory Visit: Payer: Self-pay

## 2022-07-21 DIAGNOSIS — I10 Essential (primary) hypertension: Secondary | ICD-10-CM

## 2022-07-21 MED ORDER — LISINOPRIL 20 MG PO TABS: 20.0000 mg | ORAL_TABLET | Freq: Every day | ORAL | 0 refills | Status: DC

## 2022-07-21 NOTE — Telephone Encounter (Signed)
90 day refill sent in. PT needs TOC within next 90 days for further refills.

## 2022-09-14 ENCOUNTER — Ambulatory Visit (INDEPENDENT_AMBULATORY_CARE_PROVIDER_SITE_OTHER): Payer: BC Managed Care – PPO | Admitting: Nurse Practitioner

## 2022-09-14 ENCOUNTER — Encounter: Payer: Self-pay | Admitting: Nurse Practitioner

## 2022-09-14 VITALS — BP 114/66 | HR 78 | Temp 98.4°F | Resp 16 | Ht 69.0 in | Wt 341.4 lb

## 2022-09-14 DIAGNOSIS — I10 Essential (primary) hypertension: Secondary | ICD-10-CM | POA: Diagnosis not present

## 2022-09-14 DIAGNOSIS — Z23 Encounter for immunization: Secondary | ICD-10-CM

## 2022-09-14 DIAGNOSIS — Z1211 Encounter for screening for malignant neoplasm of colon: Secondary | ICD-10-CM

## 2022-09-14 DIAGNOSIS — Z800 Family history of malignant neoplasm of digestive organs: Secondary | ICD-10-CM

## 2022-09-14 DIAGNOSIS — H6121 Impacted cerumen, right ear: Secondary | ICD-10-CM | POA: Diagnosis not present

## 2022-09-14 DIAGNOSIS — Z720 Tobacco use: Secondary | ICD-10-CM | POA: Diagnosis not present

## 2022-09-14 DIAGNOSIS — G4733 Obstructive sleep apnea (adult) (pediatric): Secondary | ICD-10-CM | POA: Diagnosis not present

## 2022-09-14 DIAGNOSIS — R7303 Prediabetes: Secondary | ICD-10-CM

## 2022-09-14 NOTE — Assessment & Plan Note (Signed)
Patient up to 2 pack-a-day smoker.  Does not qualify for LDCT currently

## 2022-09-14 NOTE — Progress Notes (Signed)
Established Patient Office Visit  Subjective   Patient ID: Eugene Mata, male    DOB: 12/16/1980  Age: 41 y.o. MRN: [id number]  Chief Complaint  Patient presents with   Establish Care    Transfer from Dr. Selena Batten    HPI  [facility]: Last visit 02/07/2022 with Gweneth Dimitri, MD  HTN: Does not check blood pressure and tolerates the medicaiton well   OSA: states athta he gets 6-7 hours with CPAP. States that he has gotten the machine years ago and got a new machine last year  Prediabetes: states that been off ozempic for approx 4 months. States that he had a coupon but has expired. Did have results.  Exercise: employment no strutured exercise Diet: 1 meal a day. With some snacking. States that he will drink coffee water, sweet tea and occ soda.   TDAP: update today Flu: refused Covid: refused  Colonoscopy: 03/08/2018, recall 5 years     Review of Systems  Constitutional:  Negative for chills and fever.  Respiratory:  Negative for shortness of breath.   Cardiovascular:  Negative for chest pain and leg swelling.  Gastrointestinal:  Negative for abdominal pain, blood in stool, constipation, diarrhea, nausea and vomiting.       BM daily   Genitourinary:  Negative for dysuria and hematuria.  Neurological:  Negative for headaches.  Psychiatric/Behavioral:  Negative for hallucinations and suicidal ideas.       Objective:     BP 114/66   Pulse 78   Temp 98.4 F (36.9 C)   Resp 16   Ht 5\' 9"  (1.753 m)   Wt (!) 341 lb 6 oz (154.8 kg)   SpO2 97%   BMI 50.41 kg/m  BP Readings from Last 3 Encounters:  09/14/22 114/66  02/07/22 118/70  11/29/21 130/76   Wt Readings from Last 3 Encounters:  09/14/22 (!) 341 lb 6 oz (154.8 kg)  02/07/22 (!) 351 lb 3 oz (159.3 kg)  11/29/21 (!) 360 lb 2 oz (163.4 kg)      Physical Exam Vitals and nursing note reviewed.  Constitutional:      Appearance: Normal appearance. He is obese.  HENT:     Right Ear: Ear canal and external ear  normal. There is impacted cerumen.     Left Ear: Tympanic membrane, ear canal and external ear normal.     Mouth/Throat:     Mouth: Mucous membranes are moist.     Pharynx: Oropharynx is clear.  Eyes:     Extraocular Movements: Extraocular movements intact.     Pupils: Pupils are equal, round, and reactive to light.  Cardiovascular:     Rate and Rhythm: Normal rate and regular rhythm.     Heart sounds: Normal heart sounds.  Pulmonary:     Effort: Pulmonary effort is normal.     Breath sounds: Normal breath sounds.  Abdominal:     General: Bowel sounds are normal.  Musculoskeletal:     Right lower leg: No edema.     Left lower leg: No edema.  Lymphadenopathy:     Cervical: No cervical adenopathy.  Skin:    General: Skin is warm.  Neurological:     General: No focal deficit present.     Mental Status: He is alert.     Comments: Bilateral upper and lower extremity strength 5/5  Psychiatric:        Mood and Affect: Mood normal.        Behavior: Behavior normal.  Thought Content: Thought content normal.        Judgment: Judgment normal.      No results found for any visits on 09/14/22.    The 10-year ASCVD risk score (Arnett DK, et al., 2019) is: 5.4%    Assessment & Plan:   Problem List Items Addressed This Visit       Cardiovascular and Mediastinum   HTN (hypertension) - Primary    Patient currently maintained on lisinopril.  Blood pressure within normal limits.  Continue lisinopril as prescribed        Respiratory   Sleep apnea, obstructive    Patient states he is adherent to CPAP therapy continue using        Nervous and Auditory   Impacted cerumen of right ear    Verbal consent obtained.  Patient was prepped per office policy.  Mixture of water and hydroperoxide was used and ear irrigated.  Patient tolerated procedure well.  Impaction was removed successfully      Relevant Orders   Ear Lavage     Other   Morbid obesity (HCC)    Continue  working on lifestyle modifications.      Tobacco use    Patient up to 2 pack-a-day smoker.  Does not qualify for LDCT currently      Family history of colon cancer    Sister at age 30.  Patient has had 1 colonoscopy will be due for additional.  Ambulatory referral to GI today      Prediabetes    Patient is continuing losing weight.  Not currently on semaglutide did discuss Wegovy and Zepbound patient will reach out to me if he is interested in trying these medications      Other Visit Diagnoses     Screening for colon cancer       Relevant Orders   Ambulatory referral to Gastroenterology   Need for tetanus, diphtheria, and acellular pertussis (Tdap) vaccine       Relevant Orders   Tdap vaccine greater than or equal to 7yo IM (Completed)       Return in about 6 months (around 03/16/2023) for CPE and Labs.    Audria Nine, NP

## 2022-09-14 NOTE — Assessment & Plan Note (Signed)
Patient currently maintained on lisinopril.  Blood pressure within normal limits.  Continue lisinopril as prescribed

## 2022-09-14 NOTE — Patient Instructions (Signed)
Nice to see you today I want to see you in 6 months for your physical, Sooner if you need me

## 2022-09-14 NOTE — Assessment & Plan Note (Signed)
Verbal consent obtained.  Patient was prepped per office policy.  Mixture of water and hydroperoxide was used and ear irrigated.  Patient tolerated procedure well.  Impaction was removed successfully

## 2022-09-14 NOTE — Assessment & Plan Note (Signed)
Sister at age 41.  Patient has had 1 colonoscopy will be due for additional.  Ambulatory referral to GI today

## 2022-09-14 NOTE — Assessment & Plan Note (Signed)
Continue working on lifestyle modifications 

## 2022-09-14 NOTE — Assessment & Plan Note (Signed)
Patient states he is adherent to CPAP therapy continue using

## 2022-09-14 NOTE — Assessment & Plan Note (Signed)
Patient is continuing losing weight.  Not currently on semaglutide did discuss Wegovy and Zepbound patient will reach out to me if he is interested in trying these medications

## 2022-12-22 ENCOUNTER — Other Ambulatory Visit: Payer: Self-pay

## 2022-12-22 ENCOUNTER — Encounter: Payer: Self-pay | Admitting: Nurse Practitioner

## 2022-12-22 ENCOUNTER — Telehealth: Payer: Self-pay

## 2022-12-22 DIAGNOSIS — Z86010 Personal history of colonic polyps: Secondary | ICD-10-CM

## 2022-12-22 DIAGNOSIS — I10 Essential (primary) hypertension: Secondary | ICD-10-CM

## 2022-12-22 MED ORDER — NA SULFATE-K SULFATE-MG SULF 17.5-3.13-1.6 GM/177ML PO SOLN: 1.0000 | Freq: Once | ORAL | 0 refills | Status: AC

## 2022-12-22 NOTE — Telephone Encounter (Signed)
Gastroenterology Pre-Procedure Review  Request Date: 01/16/23 Requesting Physician: Dr. Marius Ditch  PATIENT REVIEW QUESTIONS: The patient responded to the following health history questions as indicated:    1. Are you having any GI issues? no 2. Do you have a personal history of Polyps? yes (Last colonoscopy performed by Dr. Gustavo Lah 03/08/18 several polyps were noted) 3. Do you have a family history of Colon Cancer or Polyps? no 4. Diabetes Mellitus? no 5. Joint replacements in the past 12 months?no 6. Major health problems in the past 3 months?no 7. Any artificial heart valves, MVP, or defibrillator?no    MEDICATIONS & ALLERGIES:    Patient reports the following regarding taking any anticoagulation/antiplatelet therapy:   Plavix, Coumadin, Eliquis, Xarelto, Lovenox, Pradaxa, Brilinta, or Effient? no Aspirin? no  Patient confirms/reports the following medications:  Current Outpatient Medications  Medication Sig Dispense Refill   lisinopril (ZESTRIL) 20 MG tablet Take 1 tablet (20 mg total) by mouth daily. 90 tablet 0   Semaglutide, 2 MG/DOSE, 8 MG/3ML SOPN Inject 2 mg as directed once a week. (Patient not taking: Reported on 09/14/2022) 9 mL 0   UNABLE TO FIND CPAP pressure setting at 11     No current facility-administered medications for this visit.    Patient confirms/reports the following allergies:  No Known Allergies  No orders of the defined types were placed in this encounter.   AUTHORIZATION INFORMATION Primary Insurance: 1D#: Group #:  Secondary Insurance: 1D#: Group #:  SCHEDULE INFORMATION: Date: 01/16/23 Time: Location: [facility]

## 2022-12-22 NOTE — Telephone Encounter (Signed)
We can fax the last office notes to them. They generally will send an order form for the supplies

## 2022-12-23 ENCOUNTER — Other Ambulatory Visit: Payer: Self-pay

## 2022-12-23 DIAGNOSIS — I10 Essential (primary) hypertension: Secondary | ICD-10-CM

## 2022-12-23 MED ORDER — LISINOPRIL 20 MG PO TABS
20.0000 mg | ORAL_TABLET | Freq: Every day | ORAL | 0 refills | Status: DC
Start: 2022-12-23 — End: 2023-03-21

## 2022-12-23 NOTE — Telephone Encounter (Signed)
Spoke to pt, scheduled cpe for 03/09/23. Is this appt too far out?

## 2022-12-23 NOTE — Telephone Encounter (Signed)
Lisinopril 20MG  tablet Last visit 09/14/2022 Next visit Per Susy Frizzle come back in 6 months 03/13/2023

## 2022-12-23 NOTE — Telephone Encounter (Signed)
Called and spoke to pt and informed him that the company needs to send [place] an order for CPAP supplies.

## 2022-12-23 NOTE — Telephone Encounter (Signed)
Can we get the patient scheduled for a CPE in a months time please

## 2022-12-26 NOTE — Telephone Encounter (Signed)
Spoke with pt, r/s cpe to 01/17/23

## 2023-01-10 ENCOUNTER — Ambulatory Visit (INDEPENDENT_AMBULATORY_CARE_PROVIDER_SITE_OTHER): Payer: BC Managed Care – PPO

## 2023-01-10 ENCOUNTER — Ambulatory Visit: Payer: BC Managed Care – PPO | Admitting: Podiatry

## 2023-01-10 DIAGNOSIS — M79671 Pain in right foot: Secondary | ICD-10-CM

## 2023-01-10 DIAGNOSIS — M79672 Pain in left foot: Secondary | ICD-10-CM

## 2023-01-10 DIAGNOSIS — M2142 Flat foot [pes planus] (acquired), left foot: Secondary | ICD-10-CM

## 2023-01-10 DIAGNOSIS — M2141 Flat foot [pes planus] (acquired), right foot: Secondary | ICD-10-CM

## 2023-01-10 NOTE — Progress Notes (Signed)
   Chief Complaint  Patient presents with   Foot Orthotics    Patient came in today for orthotics and callus trim, X-Rays done today     Subjective: Patient presents to the office today for chief complaint of painful callus lesions of the feet. Patient states that the pain is ongoing and is affecting their ability to ambulate without pain.  Patient also has a history of chronic bilateral foot pain.  He did have custom molded orthotics back in 2018 and they did help significantly alleviate some of the pain and pressure on his feet.  He is requesting new orthotics today.  Objective:  Physical Exam General: Alert and oriented x3 in no acute distress  Dermatology: Hyperkeratotic lesion present on the bilaterally x 5. Pain on palpation with a central nucleated core noted.  Skin is warm, dry and supple bilateral lower extremities. Negative for open lesions or macerations.  Vascular: Palpable pedal pulses bilaterally. No edema or erythema noted. Capillary refill within normal limits.  Neurological: Grossly intact via light touch  Musculoskeletal Exam: Pain on palpation at the keratotic lesion noted. Range of motion within normal limits bilateral. Muscle strength 5/5 in all groups bilateral.  With weightbearing there is collapse of the medial longitudinal arch of the foot and moderate pes planovalgus deformity  Radiographic exam B/L feet 01/10/2023: Normal osseous mineralization.  Collapse of the medial longitudinal arch of the foot with a decreased calcaneal inclination angle and metatarsal declination angle noted consistent with pes planovalgus deformity.  No acute fractures identified.   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  #3  Recommend  daily foot lotion.  Advised against going barefoot #4 appointment with orthotics department for new custom molded orthotics #5 return to clinic as needed with me  Felecia Shelling, DPM [facility]  Dr. Felecia Shelling, DPM    [address] [place], [place] [address]                Office [phone]  Fax [phone]

## 2023-01-11 DIAGNOSIS — G4733 Obstructive sleep apnea (adult) (pediatric): Secondary | ICD-10-CM | POA: Diagnosis not present

## 2023-01-16 ENCOUNTER — Encounter: Payer: Self-pay | Admitting: Gastroenterology

## 2023-01-16 ENCOUNTER — Ambulatory Visit: Payer: BC Managed Care – PPO | Admitting: Anesthesiology

## 2023-01-16 ENCOUNTER — Encounter
Admission: RE | Disposition: A | Discharge status: Home / Self Care | Payer: Self-pay | Source: Home / Self Care | Attending: Gastroenterology

## 2023-01-16 ENCOUNTER — Ambulatory Visit
Admission: RE | Admit: 2023-01-16 | Discharge: 2023-01-16 | Disposition: A | Discharge status: Home / Self Care | Payer: BC Managed Care – PPO | Attending: Gastroenterology | Admitting: Gastroenterology

## 2023-01-16 DIAGNOSIS — Z800 Family history of malignant neoplasm of digestive organs: Secondary | ICD-10-CM | POA: Insufficient documentation

## 2023-01-16 DIAGNOSIS — G4730 Sleep apnea, unspecified: Secondary | ICD-10-CM | POA: Insufficient documentation

## 2023-01-16 DIAGNOSIS — D128 Benign neoplasm of rectum: Secondary | ICD-10-CM | POA: Diagnosis not present

## 2023-01-16 DIAGNOSIS — K635 Polyp of colon: Secondary | ICD-10-CM | POA: Diagnosis not present

## 2023-01-16 DIAGNOSIS — F17210 Nicotine dependence, cigarettes, uncomplicated: Secondary | ICD-10-CM | POA: Diagnosis not present

## 2023-01-16 DIAGNOSIS — K621 Rectal polyp: Secondary | ICD-10-CM | POA: Diagnosis not present

## 2023-01-16 DIAGNOSIS — Z860100 Personal history of colon polyps, unspecified: Secondary | ICD-10-CM

## 2023-01-16 DIAGNOSIS — Z1211 Encounter for screening for malignant neoplasm of colon: Secondary | ICD-10-CM | POA: Insufficient documentation

## 2023-01-16 DIAGNOSIS — R7303 Prediabetes: Secondary | ICD-10-CM | POA: Diagnosis not present

## 2023-01-16 DIAGNOSIS — I10 Essential (primary) hypertension: Secondary | ICD-10-CM | POA: Diagnosis not present

## 2023-01-16 DIAGNOSIS — Z6841 Body Mass Index (BMI) 40.0 and over, adult: Secondary | ICD-10-CM | POA: Insufficient documentation

## 2023-01-16 HISTORY — PX: COLONOSCOPY WITH PROPOFOL: SHX5780

## 2023-01-16 SURGERY — COLONOSCOPY WITH PROPOFOL
Anesthesia: General

## 2023-01-16 MED ORDER — SODIUM CHLORIDE 0.9 % IV SOLN: INTRAVENOUS | Status: DC

## 2023-01-16 MED ORDER — PROPOFOL 500 MG/50ML IV EMUL
INTRAVENOUS | Status: DC | PRN
  Administered 2023-01-16: 100 ug/kg/min via INTRAVENOUS

## 2023-01-16 MED ORDER — STERILE WATER FOR IRRIGATION IR SOLN
Status: DC | PRN
  Administered 2023-01-16 (×2): 60 mL

## 2023-01-16 MED ORDER — GLYCOPYRROLATE 0.2 MG/ML IJ SOLN: INTRAMUSCULAR | Status: DC | PRN

## 2023-01-16 MED ORDER — LIDOCAINE HCL (CARDIAC) PF 100 MG/5ML IV SOSY
PREFILLED_SYRINGE | INTRAVENOUS | Status: DC | PRN
  Administered 2023-01-16: 50 mg via INTRAVENOUS

## 2023-01-16 MED ORDER — PROPOFOL 10 MG/ML IV BOLUS
INTRAVENOUS | Status: DC | PRN
  Administered 2023-01-16: 80 mg via INTRAVENOUS

## 2023-01-16 NOTE — Anesthesia Preprocedure Evaluation (Addendum)
Anesthesia Evaluation  Patient identified by MRN, date of birth, ID band Patient awake    Reviewed: Allergy & Precautions, NPO status , Patient's Chart, lab work & pertinent test results  History of Anesthesia Complications Negative for: history of anesthetic complications  Airway Mallampati: IV   Neck ROM: Full    Dental no notable dental hx.    Pulmonary sleep apnea and Continuous Positive Airway Pressure Ventilation , Current Smoker (1 ppd)Patient did not abstain from smoking.   Pulmonary exam normal breath sounds clear to auscultation       Cardiovascular hypertension, Normal cardiovascular exam Rhythm:Regular Rate:Normal     Neuro/Psych negative neurological ROS     GI/Hepatic negative GI ROS,,,  Endo/Other  Class 3 obesity; prediabetes  Renal/GU negative Renal ROS     Musculoskeletal   Abdominal   Peds  Hematology negative hematology ROS (+)   Anesthesia Other Findings Not taking Semaglutide.  Reproductive/Obstetrics                             Anesthesia Physical Anesthesia Plan  ASA: 3  Anesthesia Plan: General   Post-op Pain Management:    Induction: Intravenous  PONV Risk Score and Plan: 1 and Propofol infusion, TIVA and Treatment may vary due to age or medical condition  Airway Management Planned: Natural Airway  Additional Equipment:   Intra-op Plan:   Post-operative Plan:   Informed Consent: I have reviewed the patients History and Physical, chart, labs and discussed the procedure including the risks, benefits and alternatives for the proposed anesthesia with the patient or authorized representative who has indicated his/her understanding and acceptance.       Plan Discussed with: CRNA  Anesthesia Plan Comments: (LMA/GETA backup discussed.  Patient consented for risks of anesthesia including but not limited to:  - adverse reactions to medications - damage  to eyes, teeth, lips or other oral mucosa - nerve damage due to positioning  - sore throat or hoarseness - damage to heart, brain, nerves, lungs, other parts of body or loss of life  Informed patient about role of CRNA in peri- and intra-operative care.  Patient voiced understanding.)       Anesthesia Quick Evaluation

## 2023-01-16 NOTE — Op Note (Signed)
[facility] Gastroenterology Patient Name: Eugene Mata Procedure Date: 01/16/2023 9:05 AM MRN: [id number] Account #: [id number] Date of Birth: 09-Dec-1980 Admit Type: Outpatient Age: 42 Room: [facility] ENDO ROOM 4 Gender: Male Note Status: Finalized Instrument Name: Prentice Docker [id number] Procedure:             Colonoscopy Indications:           High risk colon cancer surveillance: Personal history                         of colonic polyps, Family history of colon cancer in a                         first-degree relative before age 98 years, Last                         colonoscopy: June 2019 Providers:             Toney Reil MD, MD Referring MD:          Genene Churn. Toney Reil (Referring MD) Medicines:             General Anesthesia Complications:         No immediate complications. Estimated blood loss: None. Procedure:             Pre-Anesthesia Assessment:                        - Prior to the procedure, a History and Physical was                         performed, and patient medications and allergies were                         reviewed. The patient is competent. The risks and                         benefits of the procedure and the sedation options and                         risks were discussed with the patient. All questions                         were answered and informed consent was obtained.                         Patient identification and proposed procedure were                         verified by the physician, the nurse, the                         anesthesiologist, the anesthetist and the technician                         in the pre-procedure area in the procedure room in the                         endoscopy suite. Mental Status Examination: alert and  oriented. Airway Examination: normal oropharyngeal                         airway and neck mobility. Respiratory Examination:                         clear to auscultation. CV  Examination: normal.                         Prophylactic Antibiotics: The patient does not require                         prophylactic antibiotics. Prior Anticoagulants: The                         patient has taken no anticoagulant or antiplatelet                         agents. ASA Grade Assessment: III - A patient with                         severe systemic disease. After reviewing the risks and                         benefits, the patient was deemed in satisfactory                         condition to undergo the procedure. The anesthesia                         plan was to use general anesthesia. Immediately prior                         to administration of medications, the patient was                         re-assessed for adequacy to receive sedatives. The                         heart rate, respiratory rate, oxygen saturations,                         blood pressure, adequacy of pulmonary ventilation, and                         response to care were monitored throughout the                         procedure. The physical status of the patient was                         re-assessed after the procedure.                        After obtaining informed consent, the colonoscope was                         passed under direct vision. Throughout the procedure,  the patient's blood pressure, pulse, and oxygen                         saturations were monitored continuously. The                         Colonoscope was introduced through the anus and                         advanced to the the cecum, identified by appendiceal                         orifice and ileocecal valve. The colonoscopy was                         performed without difficulty. The patient tolerated                         the procedure well. The quality of the bowel                         preparation was evaluated using the BBPS [facility] Bowel                         Preparation Scale)  with scores of: Right Colon = 3,                         Transverse Colon = 3 and Left Colon = 3 (entire mucosa                         seen well with no residual staining, small fragments                         of stool or opaque liquid). The total BBPS score                         equals 9. The ileocecal valve, appendiceal orifice,                         and rectum were photographed. Findings:      The perianal and digital rectal examinations were normal. Pertinent       negatives include normal sphincter tone and no palpable rectal lesions.      Two sessile polyps were found in the rectum. The polyps were diminutive       in size. These polyps were removed with a jumbo cold forceps. Resection       and retrieval were complete.      The retroflexed view of the distal rectum and anal verge was normal and       showed no anal or rectal abnormalities.      The exam was otherwise without abnormality. Impression:            - Two diminutive polyps in the rectum, removed with a                         jumbo cold forceps. Resected and retrieved.                        -  The distal rectum and anal verge are normal on                         retroflexion view.                        - The examination was otherwise normal. Recommendation:        - Discharge patient to home (with escort).                        - Resume previous diet today.                        - Continue present medications.                        - Await pathology results.                        - Repeat colonoscopy in 5 years for surveillance. Procedure Code(s):     --- Professional ---                        [phone], Colonoscopy, flexible; with biopsy, single or                         multiple Diagnosis Code(s):     --- Professional ---                        Z86.010, Personal history of colonic polyps                        D12.8, Benign neoplasm of rectum                        Z80.0, Family history of malignant  neoplasm of                         digestive organs CPT copyright 2022 American Medical Association. All rights reserved. The codes documented in this report are preliminary and upon coder review may  be revised to meet current compliance requirements. Dr. Libby Maw Toney Reil MD, MD 01/16/2023 9:36:51 AM This report has been signed electronically. Number of Addenda: 0 Note Initiated On: 01/16/2023 9:05 AM Scope Withdrawal Time: 0 hours 10 minutes 5 seconds  Total Procedure Duration: 0 hours 12 minutes 18 seconds  Estimated Blood Loss:  Estimated blood loss: none.      [facility]

## 2023-01-16 NOTE — Anesthesia Postprocedure Evaluation (Signed)
Anesthesia Post Note  Patient: Eugene Mata  Procedure(s) Performed: COLONOSCOPY WITH PROPOFOL  Patient location during evaluation: PACU Anesthesia Type: General Level of consciousness: awake and alert, oriented and patient cooperative Pain management: pain level controlled Vital Signs Assessment: post-procedure vital signs reviewed and stable Respiratory status: spontaneous breathing, nonlabored ventilation and respiratory function stable Cardiovascular status: blood pressure returned to baseline and stable Postop Assessment: adequate PO intake Anesthetic complications: no   No notable events documented.   Last Vitals:  Vitals:   01/16/23 0947 01/16/23 0957  BP: 110/69 (!) 116/59  Pulse: 71 65  Resp: 14 16  Temp:    SpO2: 98% 100%    Last Pain:  Vitals:   01/16/23 0957  TempSrc:   PainSc: 0-No pain                 Reed Breech

## 2023-01-16 NOTE — H&P (Signed)
Eugene Repress, MD [address]  [address]  [place], [place] [address]  Main: [phone]  Fax: [phone] Pager: [phone]  Primary Care Physician:  Eugene Emms, NP Primary Gastroenterologist:  Dr. Arlyss Mata  Pre-Procedure History & Physical: HPI:  Eugene Mata is a 42 y.o. male is here for an colonoscopy.   Past Medical History:  Diagnosis Date   Hypertension    Sleep apnea    uses CPAP at home    Past Surgical History:  Procedure Laterality Date   COLONOSCOPY WITH PROPOFOL N/A 03/08/2018   Procedure: COLONOSCOPY WITH PROPOFOL;  Surgeon: Eugene Deem, MD;  Location: [facility] ENDOSCOPY;  Service: Endoscopy;  Laterality: N/A;   head cyst removal     WISDOM TOOTH EXTRACTION      Prior to Admission medications   Medication Sig Start Date End Date Taking? Authorizing Provider  lisinopril (ZESTRIL) 20 MG tablet Take 1 tablet (20 mg total) by mouth daily. 12/23/22  Yes Eugene Emms, NP  Semaglutide, 2 MG/DOSE, 8 MG/3ML SOPN Inject 2 mg as directed once a week. Patient not taking: Reported on 09/14/2022 02/07/22   Eugene Dimitri, MD  UNABLE TO FIND CPAP pressure setting at 11    [provider]    Allergies as of 12/22/2022   (No Known Allergies)    Family History  Problem Relation Age of Onset   Cervical cancer Mother    Colon cancer Sister 84       metastasized   Heart attack Other        thinks grandparents    Social History   Socioeconomic History   Marital status: Single    Spouse name: Not on file   Number of children: 0   Years of education: community college   Highest education level: Not on file  Occupational History   Not on file  Tobacco Use   Smoking status: Every Day    Packs/day: 2.00    Years: 23.00    Additional pack years: 0.00    Total pack years: 46.00    Types: Cigarettes   Smokeless tobacco: Former    Quit date: 1999  Vaping Use   Vaping Use: Never used  Substance and Sexual Activity   Alcohol use: Yes     Comment: rarely   Drug use: No   Sexual activity: Not Currently  Other Topics Concern   Not on file  Social History Narrative   10/29/19   From: the area   Living: living alone   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      Family: mom is around and good relationship      Enjoys: not much      Exercise: not currently, has stationary bike   Diet: not Product/process development scientist   Seat belts: Yes    Guns: Yes    Safe in relationships: Yes    Social Determinants of Corporate investment banker Strain: Not on file  Food Insecurity: Not on file  Transportation Needs: Not on file  Physical Activity: Not on file  Stress: Not on file  Social Connections: Not on file  Intimate Partner Violence: Not on file    Review of Systems: See HPI, otherwise negative ROS  Physical Exam: BP (!) 116/59   Pulse 65   Temp (!) 97 F (36.1 C) (Tympanic)   Resp 16   Ht 5\' 8"  (1.727 m)  Wt (!) 340 lb (154.2 kg)   SpO2 100%   BMI 51.70 kg/m  General:   Alert,  pleasant and cooperative in NAD Head:  Normocephalic and atraumatic. Neck:  Supple; no masses or thyromegaly. Lungs:  Clear throughout to auscultation.    Heart:  Regular rate and rhythm. Abdomen:  Soft, nontender and nondistended. Normal bowel sounds, without guarding, and without rebound.   Neurologic:  Alert and  oriented x4;  grossly normal neurologically.  Impression/Plan: Eugene Mata is here for an colonoscopy to be performed for h/o colon adenoma  Risks, benefits, limitations, and alternatives regarding  colonoscopy have been reviewed with the patient.  Questions have been answered.  All parties agreeable.   Lannette Donath, MD  01/16/2023, 10:30 AM

## 2023-01-16 NOTE — Transfer of Care (Signed)
Immediate Anesthesia Transfer of Care Note  Patient: Eugene Mata  Procedure(s) Performed: COLONOSCOPY WITH PROPOFOL  Patient Location: PACU  Anesthesia Type:General  Level of Consciousness: awake, alert , and oriented  Airway & Oxygen Therapy: Patient Spontanous Breathing  Post-op Assessment: Report given to RN and Post -op Vital signs reviewed and stable  Post vital signs: Reviewed and stable  Last Vitals:  Vitals Value Taken Time  BP 100/52 01/16/23 0937  Temp 36.1 C 01/16/23 0937  Pulse 74 01/16/23 0938  Resp 16 01/16/23 0938  SpO2 99 % 01/16/23 0938  Vitals shown include unvalidated device data.  Last Pain:  Vitals:   01/16/23 0937  TempSrc: Tympanic  PainSc: Asleep         Complications: No notable events documented.

## 2023-01-17 ENCOUNTER — Encounter: Payer: Self-pay | Admitting: Gastroenterology

## 2023-01-17 ENCOUNTER — Encounter: Payer: Self-pay | Admitting: Nurse Practitioner

## 2023-01-17 ENCOUNTER — Other Ambulatory Visit: Payer: Self-pay | Admitting: Podiatry

## 2023-01-17 ENCOUNTER — Ambulatory Visit (INDEPENDENT_AMBULATORY_CARE_PROVIDER_SITE_OTHER): Payer: BC Managed Care – PPO | Admitting: Nurse Practitioner

## 2023-01-17 VITALS — BP 110/62 | HR 90 | Temp 98.2°F | Resp 16 | Ht 68.0 in | Wt 336.2 lb

## 2023-01-17 DIAGNOSIS — M2141 Flat foot [pes planus] (acquired), right foot: Secondary | ICD-10-CM

## 2023-01-17 DIAGNOSIS — E7800 Pure hypercholesterolemia, unspecified: Secondary | ICD-10-CM

## 2023-01-17 DIAGNOSIS — G4733 Obstructive sleep apnea (adult) (pediatric): Secondary | ICD-10-CM | POA: Diagnosis not present

## 2023-01-17 DIAGNOSIS — I10 Essential (primary) hypertension: Secondary | ICD-10-CM

## 2023-01-17 DIAGNOSIS — R7303 Prediabetes: Secondary | ICD-10-CM

## 2023-01-17 DIAGNOSIS — Z0000 Encounter for general adult medical examination without abnormal findings: Secondary | ICD-10-CM

## 2023-01-17 DIAGNOSIS — Z720 Tobacco use: Secondary | ICD-10-CM | POA: Diagnosis not present

## 2023-01-17 DIAGNOSIS — M79671 Pain in right foot: Secondary | ICD-10-CM

## 2023-01-17 LAB — LIPID PANEL
Cholesterol: 181 mg/dL (ref 0–200)
HDL: 29.7 mg/dL — ABNORMAL LOW (ref >39.00)
LDL Cholesterol: 130 mg/dL — ABNORMAL HIGH (ref 0–99)
NonHDL: 151
Total CHOL/HDL Ratio: 6
Triglycerides: 104 mg/dL (ref 0.0–149.0)
VLDL: 20.8 mg/dL (ref 0.0–40.0)

## 2023-01-17 LAB — CBC
HCT: 45.6 % (ref 39.0–52.0)
Hemoglobin: 15.5 g/dL (ref 13.0–17.0)
MCHC: 33.9 g/dL (ref 30.0–36.0)
MCV: 93.8 fl (ref 78.0–100.0)
Platelets: 257 10*3/uL (ref 150.0–400.0)
RBC: 4.86 Mil/uL (ref 4.22–5.81)
RDW: 14.4 % (ref 11.5–15.5)
WBC: 7.6 10*3/uL (ref 4.0–10.5)

## 2023-01-17 LAB — COMPREHENSIVE METABOLIC PANEL
ALT: 22 U/L (ref 0–53)
AST: 18 U/L (ref 0–37)
Albumin: 3.9 g/dL (ref 3.5–5.2)
Alkaline Phosphatase: 59 U/L (ref 39–117)
BUN: 10 mg/dL (ref 6–23)
CO2: 21 mEq/L (ref 19–32)
Calcium: 8.8 mg/dL (ref 8.4–10.5)
Chloride: 106 mEq/L (ref 96–112)
Creatinine, Ser: 0.83 mg/dL (ref 0.40–1.50)
GFR: 108.55 mL/min (ref >60.00)
Glucose, Bld: 92 mg/dL (ref 70–99)
Potassium: 4 mEq/L (ref 3.5–5.1)
Sodium: 139 mEq/L (ref 135–145)
Total Bilirubin: 0.4 mg/dL (ref 0.2–1.2)
Total Protein: 6.6 g/dL (ref 6.0–8.3)

## 2023-01-17 LAB — HEMOGLOBIN A1C: Hgb A1c MFr Bld: 5.6 % (ref 4.6–6.5)

## 2023-01-17 LAB — SURGICAL PATHOLOGY

## 2023-01-17 LAB — URINALYSIS, MICROSCOPIC ONLY: RBC / HPF: NONE SEEN (ref 0–?)

## 2023-01-17 LAB — TSH: TSH: 1.58 u[IU]/mL (ref 0.35–5.50)

## 2023-01-17 NOTE — Patient Instructions (Signed)
Nice to see you today I will be in touch with the labs once I have reviewed them I am ok to see you once a year currently for you physical and labs   Zepbound and wegovy are the two once a week injectable medications that are used for weight loss.

## 2023-01-17 NOTE — Assessment & Plan Note (Signed)
Patient is still working on losing weight and has been off of semaglutide for extended period of time.  Continue working lifestyle modifications pending A1c

## 2023-01-17 NOTE — Assessment & Plan Note (Signed)
Patient currently maintained on CPAP here to therapy recently sent orders for new supplies.  Continue

## 2023-01-17 NOTE — Assessment & Plan Note (Signed)
Patient is still losing weight.  Continue working lifestyle modifications.  Did write down Zepbound and  Reginal Lutes he will check with his insurance

## 2023-01-17 NOTE — Progress Notes (Signed)
Established Patient Office Visit  Subjective   Patient ID: Eugene Mata, male    DOB: [birth year]  Age: 42 y.o. MRN: [id number]  Chief Complaint  Patient presents with   Annual Exam    HPI  for complete physical and follow up of chronic conditions.   OSA: CPAP therapy. Gets 6-7 hours of sleep   HTN: does not check blood pressure at home. No lugth headedness ofr dizziness   Semaglutide: patient starting weight was 372 and he is down to 336 today  Immunizations: -Tetanus: Completed in 2023 -Influenza:  refused  -Shingles: too young -Pneumonia:too young - covid: refused  Diet: Fair diet. 1-2 meals a day and some snacking. Water, sweet tea n someties soda  Exercise: No regular exercise. Very little. Yard work   [employer] exam: Completes annually. Glasses. Seen 2 years ago   Dental exam: Completes semi-annually    Colonoscopy: Completed in 01/17/2023 Lung Cancer Screening: does not qualify  PSA: Too young, currently average risk  Sleep: 1030-11 and gets up at 530.  Does use CPAP      Review of Systems  Constitutional:  Negative for chills and fever.  Respiratory:  Negative for shortness of breath.   Cardiovascular:  Negative for chest pain and leg swelling.  Gastrointestinal:  Negative for abdominal pain, blood in stool, constipation, diarrhea, nausea and vomiting.       BM daily   Genitourinary:  Negative for dysuria and hematuria.  Neurological:  Negative for tingling and headaches.  Psychiatric/Behavioral:  Negative for hallucinations and suicidal ideas.       Objective:     BP 110/62   Pulse 90   Temp 98.2 F (36.8 C)   Resp 16   Ht 5\' 8"  (1.727 m)   Wt (!) 336 lb 4 oz (152.5 kg)   SpO2 96%   BMI 51.13 kg/m  BP Readings from Last 3 Encounters:  01/17/23 110/62  01/16/23 (!) 116/59  09/14/22 114/66   Wt Readings from Last 3 Encounters:  01/17/23 (!) 336 lb 4 oz (152.5 kg)  01/16/23 (!) 340 lb (154.2 kg)  09/14/22 (!) 341 lb 6 oz (154.8 kg)       Physical Exam Vitals and nursing note reviewed. Exam conducted with a chaperone present Tresa Endo Fiscal, CMA).  Constitutional:      Appearance: Normal appearance.  HENT:     Right Ear: Tympanic membrane, ear canal and external ear normal.     Left Ear: Tympanic membrane, ear canal and external ear normal.     Mouth/Throat:     Mouth: Mucous membranes are moist.     Pharynx: Oropharynx is clear.  Eyes:     Extraocular Movements: Extraocular movements intact.     Pupils: Pupils are equal, round, and reactive to light.  Cardiovascular:     Rate and Rhythm: Normal rate and regular rhythm.     Pulses: Normal pulses.     Heart sounds: Normal heart sounds.  Pulmonary:     Effort: Pulmonary effort is normal.     Breath sounds: Normal breath sounds.  Abdominal:     General: Bowel sounds are normal. There is no distension.     Palpations: There is no mass.     Tenderness: There is no abdominal tenderness.     Hernia: No hernia is present. There is no hernia in the left inguinal area or right inguinal area.  Genitourinary:    Penis: Normal.      Testes: Normal.  Epididymis:     Right: Normal.     Left: Normal.    Musculoskeletal:     Right lower leg: No edema.     Left lower leg: No edema.  Lymphadenopathy:     Cervical: No cervical adenopathy.     Lower Body: No right inguinal adenopathy. No left inguinal adenopathy.  Skin:    General: Skin is warm.  Neurological:     General: No focal deficit present.     Mental Status: He is alert.     Deep Tendon Reflexes:     Reflex Scores:      Bicep reflexes are 2+ on the right side and 2+ on the left side.      Patellar reflexes are 2+ on the right side and 2+ on the left side.    Comments: Bilateral upper and lower extremity strength 5/5  Psychiatric:        Mood and Affect: Mood normal.        Behavior: Behavior normal.        Thought Content: Thought content normal.        Judgment: Judgment normal.      No results  found for any visits on 01/17/23.    The 10-year ASCVD risk score (Arnett DK, et al., 2019) is: 5.1%    Assessment & Plan:   Problem List Items Addressed This Visit       Cardiovascular and Mediastinum   HTN (hypertension)    Patient currently maintained on lisinopril 20 mg daily.  Taking medication as prescribed no adverse drug events blood pressure within normal limits continue.      Relevant Orders   Hemoglobin A1c   TSH     Respiratory   Sleep apnea, obstructive    Patient currently maintained on CPAP here to therapy recently sent orders for new supplies.  Continue        Other   Morbid obesity (HCC)    Patient is still losing weight.  Continue working lifestyle modifications.  Did write down Zepbound and  Reginal Lutes he will check with his insurance      Relevant Orders   Hemoglobin A1c   TSH   Lipid panel   Tobacco use    Current tobacco product use will send off urine microscopy to rule out microscopic hematuria.  Does not qualify for LDCT currently      Relevant Orders   Urine Microscopic   Prediabetes    Patient is still working on losing weight and has been off of semaglutide for extended period of time.  Continue working lifestyle modifications pending A1c      Relevant Orders   Hemoglobin A1c   TSH   Pure hypercholesterolemia    Patient is been working on losing weight pending lipid panel today      Relevant Orders   TSH   Lipid panel   Preventative health care - Primary    Discussed age-appropriate immunizations and screening exams.  Did review patient's personal, surgical, social, family histories.  Patient is up-to-date on age-appropriate vaccinations.  Up-to-date on CRC screening too young for prostate cancer screening does not qualify for lung cancer screening.  Patient was given information at discharge about preventative healthcare maintenance with anticipatory guidance.      Relevant Orders   CBC   Comprehensive metabolic panel   TSH     Return in about 1 year (around 01/17/2024) for CPE and Labs.    Audria Nine, NP

## 2023-01-17 NOTE — Assessment & Plan Note (Signed)
Patient is been working on losing weight pending lipid panel today

## 2023-01-17 NOTE — Assessment & Plan Note (Signed)
Current tobacco product use will send off urine microscopy to rule out microscopic hematuria.  Does not qualify for LDCT currently

## 2023-01-17 NOTE — Assessment & Plan Note (Signed)
Patient currently maintained on lisinopril 20 mg daily.  Taking medication as prescribed no adverse drug events blood pressure within normal limits continue.

## 2023-01-17 NOTE — Assessment & Plan Note (Signed)
Discussed age-appropriate immunizations and screening exams.  Did review patient's personal, surgical, social, family histories.  Patient is up-to-date on age-appropriate vaccinations.  Up-to-date on CRC screening too young for prostate cancer screening does not qualify for lung cancer screening.  Patient was given information at discharge about preventative healthcare maintenance with anticipatory guidance.

## 2023-01-18 MED ORDER — ZEPBOUND 2.5 MG/0.5ML ~~LOC~~ SOAJ
2.5000 mg | SUBCUTANEOUS | 0 refills | Status: DC
Start: 2023-01-18 — End: 2023-01-25

## 2023-01-19 ENCOUNTER — Other Ambulatory Visit: Payer: Self-pay | Admitting: Nurse Practitioner

## 2023-01-25 ENCOUNTER — Other Ambulatory Visit: Payer: Self-pay

## 2023-01-25 DIAGNOSIS — G4733 Obstructive sleep apnea (adult) (pediatric): Secondary | ICD-10-CM

## 2023-01-25 DIAGNOSIS — E7800 Pure hypercholesterolemia, unspecified: Secondary | ICD-10-CM

## 2023-01-25 DIAGNOSIS — I10 Essential (primary) hypertension: Secondary | ICD-10-CM

## 2023-01-25 DIAGNOSIS — R7303 Prediabetes: Secondary | ICD-10-CM

## 2023-01-25 MED ORDER — ZEPBOUND 2.5 MG/0.5ML ~~LOC~~ SOAJ
2.5000 mg | SUBCUTANEOUS | 0 refills | Status: DC
Start: 2023-01-25 — End: 2023-02-08

## 2023-01-25 NOTE — Telephone Encounter (Signed)
Resent medication to [employer] will call [facility] and cancel previous RX.

## 2023-01-27 ENCOUNTER — Ambulatory Visit: Payer: BC Managed Care – PPO

## 2023-01-27 DIAGNOSIS — M2141 Flat foot [pes planus] (acquired), right foot: Secondary | ICD-10-CM

## 2023-01-27 DIAGNOSIS — M2142 Flat foot [pes planus] (acquired), left foot: Secondary | ICD-10-CM | POA: Diagnosis not present

## 2023-01-27 NOTE — Progress Notes (Unsigned)
Patient presents today to be casted for custom molded orthotics. EVANS is the treating physician.  Impression foam cast was taken. ABN signed.  Patient info-  Shoe size: 13.5  Shoe style: ATHLETIC  Height: 5FT 4IN  Weight: 363  Insurance: [employer]   Patient will be notified once orthotics arrive in office and reappoint for fitting at that time.

## 2023-02-02 ENCOUNTER — Telehealth: Payer: Self-pay

## 2023-02-02 NOTE — Telephone Encounter (Signed)
PA initiated via Covermymeds; KEY: [id number]. Awaiting determination.

## 2023-02-08 MED ORDER — ZEPBOUND 2.5 MG/0.5ML ~~LOC~~ SOAJ
2.5000 mg | SUBCUTANEOUS | 0 refills | Status: DC
Start: 2023-02-08 — End: 2023-05-11

## 2023-02-08 NOTE — Telephone Encounter (Signed)
We have a prior authorization request pending review for the requested drug for this member. For questions regarding this member request, please contact the phone number on the back of the prescription ID card. 

## 2023-02-08 NOTE — Telephone Encounter (Signed)
Called patient reviewed all information and repeated back to me. Will call if any questions.  ? ?

## 2023-02-08 NOTE — Addendum Note (Signed)
Addended by: Eden Emms on: 02/08/2023 01:33 PM   Modules accepted: Orders

## 2023-02-08 NOTE — Telephone Encounter (Signed)
PA for Zepbound 2.5 mg has been approved. Valid though 01/27/23-07/30/23

## 2023-03-09 ENCOUNTER — Encounter: Payer: BC Managed Care – PPO | Admitting: Nurse Practitioner

## 2023-03-21 ENCOUNTER — Encounter: Payer: Self-pay | Admitting: Nurse Practitioner

## 2023-03-21 DIAGNOSIS — I10 Essential (primary) hypertension: Secondary | ICD-10-CM

## 2023-03-21 MED ORDER — LISINOPRIL 20 MG PO TABS: 20.0000 mg | ORAL_TABLET | Freq: Every day | ORAL | 1 refills | Status: DC

## 2023-03-29 ENCOUNTER — Other Ambulatory Visit: Payer: BC Managed Care – PPO

## 2023-04-07 ENCOUNTER — Other Ambulatory Visit: Payer: BC Managed Care – PPO

## 2023-05-11 ENCOUNTER — Encounter: Payer: Self-pay | Admitting: Nurse Practitioner

## 2023-05-11 MED ORDER — ZEPBOUND 5 MG/0.5ML ~~LOC~~ SOAJ: 5.0000 mg | SUBCUTANEOUS | 0 refills | Status: DC

## 2023-05-29 ENCOUNTER — Encounter: Payer: Self-pay | Admitting: Nurse Practitioner

## 2023-05-29 ENCOUNTER — Ambulatory Visit: Payer: BC Managed Care – PPO | Admitting: Nurse Practitioner

## 2023-05-29 DIAGNOSIS — Z6841 Body Mass Index (BMI) 40.0 and over, adult: Secondary | ICD-10-CM | POA: Diagnosis not present

## 2023-05-29 DIAGNOSIS — R7303 Prediabetes: Secondary | ICD-10-CM | POA: Diagnosis not present

## 2023-05-29 NOTE — Assessment & Plan Note (Signed)
Patient has lost approximately 24 pounds since last office visit.  Using Zepbound titrating up to the 5 mg dose.  Follow-up 3 months continue work on healthy lifestyle modifications

## 2023-05-29 NOTE — Progress Notes (Signed)
   Established Patient Office Visit  Subjective   Patient ID: Eugene Mata, male    DOB: 1980-09-28  Age: 42 y.o. MRN: [id number]  Chief Complaint  Patient presents with   Medication Management    Pt states that he has had no problems on zepbound.    HPI  Obesity: patient was seen by me on 01/17/2023. We decided to start on zepbound. He informed me that they only way it is covered is through mail order at 3 months at time. Se did discuss this is a titratable drug. He is here for a recheck. Patient currently finishing up his 2.5 and has 3 months worth of the 5 mg at home.  Patient is also appreciable amount of weight and is doing well with the medication.  Patient states he will take over-the-counter stool softeners as needed for constipation but no overt trouble in that regards thus far     Review of Systems  Constitutional:  Negative for chills and fever.  Respiratory:  Negative for shortness of breath.   Cardiovascular:  Negative for chest pain.  Gastrointestinal:  Positive for constipation. Negative for abdominal pain, diarrhea, nausea and vomiting.  Neurological:  Negative for headaches.      Objective:     BP (!) 130/90   Pulse 78   Temp 99.4 F (37.4 C) (Temporal)   Ht 5\' 8"  (1.727 m)   Wt (!) 312 lb 12.8 oz (141.9 kg)   SpO2 94%   BMI 47.56 kg/m  BP Readings from Last 3 Encounters:  05/29/23 (!) 130/90  01/17/23 110/62  01/16/23 (!) 116/59   Wt Readings from Last 3 Encounters:  05/29/23 (!) 312 lb 12.8 oz (141.9 kg)  01/17/23 (!) 336 lb 4 oz (152.5 kg)  01/16/23 (!) 340 lb (154.2 kg)      Physical Exam Vitals and nursing note reviewed.  Constitutional:      Appearance: Normal appearance.  Cardiovascular:     Rate and Rhythm: Normal rate and regular rhythm.     Heart sounds: Normal heart sounds.  Pulmonary:     Effort: Pulmonary effort is normal.     Breath sounds: Normal breath sounds.  Abdominal:     General: Bowel sounds are normal. There is no  distension.     Palpations: There is no mass.     Tenderness: There is no abdominal tenderness.     Hernia: No hernia is present.  Neurological:     Mental Status: He is alert.      No results found for any visits on 05/29/23.    The 10-year ASCVD risk score (Arnett DK, et al., 2019) is: 8.7%    Assessment & Plan:   Problem List Items Addressed This Visit       Other   Morbid obesity (HCC) - Primary    Patient has lost approximately 24 pounds since last office visit.  Using Zepbound titrating up to the 5 mg dose.  Follow-up 3 months continue work on healthy lifestyle modifications      Prediabetes    Return in about 3 months (around 08/28/2023) for weight recheck.    Audria Nine, NP

## 2023-06-13 ENCOUNTER — Encounter: Payer: Self-pay | Admitting: Nurse Practitioner

## 2023-07-14 MED ORDER — ZEPBOUND 7.5 MG/0.5ML ~~LOC~~ SOAJ: 7.5000 mg | SUBCUTANEOUS | 0 refills | Status: DC

## 2023-07-18 ENCOUNTER — Telehealth: Payer: Self-pay

## 2023-07-18 ENCOUNTER — Other Ambulatory Visit (HOSPITAL_COMMUNITY): Payer: Self-pay

## 2023-07-18 NOTE — Telephone Encounter (Signed)
Pharmacy Patient Advocate Encounter   Received notification from Physician's Office that prior authorization for Zepbound 7.5mg  is required/requested.   Insurance verification completed.   The patient is insured through [facility] .   Per test claim: PA required; PA submitted to [facility] via CoverMyMeds Key/confirmation #/EOC Key: [id number] Status is pending

## 2023-07-20 ENCOUNTER — Other Ambulatory Visit (HOSPITAL_COMMUNITY): Payer: Self-pay

## 2023-07-20 NOTE — Telephone Encounter (Signed)
Pharmacy Patient Advocate Encounter  Received notification from [facility] that Prior Authorization for Zepbound 7.5MG /0.5ML pen-injectors has been APPROVED from 07/19/2023 to 01/16/2024   PA #/Case ID/Reference #: [id number]

## 2023-08-28 ENCOUNTER — Encounter: Payer: Self-pay | Admitting: Nurse Practitioner

## 2023-08-28 ENCOUNTER — Ambulatory Visit: Payer: BC Managed Care – PPO | Admitting: Nurse Practitioner

## 2023-08-28 VITALS — BP 130/88 | HR 80 | Temp 97.9°F | Ht 68.0 in | Wt 295.2 lb

## 2023-08-28 DIAGNOSIS — H539 Unspecified visual disturbance: Secondary | ICD-10-CM | POA: Diagnosis not present

## 2023-08-28 DIAGNOSIS — R7303 Prediabetes: Secondary | ICD-10-CM | POA: Diagnosis not present

## 2023-08-28 DIAGNOSIS — I10 Essential (primary) hypertension: Secondary | ICD-10-CM | POA: Diagnosis not present

## 2023-08-28 DIAGNOSIS — R202 Paresthesia of skin: Secondary | ICD-10-CM | POA: Insufficient documentation

## 2023-08-28 NOTE — Assessment & Plan Note (Signed)
Patient is continue to lose weight on Zepbound medication.  Continue working lifestyle modifications and using medication as directed.

## 2023-08-28 NOTE — Assessment & Plan Note (Signed)
Ambiguous in nature neurological exam intact today.  It was over 2 weeks ago query complex migraine.  Patient does work on a computer at home and this could be the culprit for his report

## 2023-08-28 NOTE — Patient Instructions (Signed)
Nice to see you today Pay attention when these episodes happen. Any weakness, headache, lightheadedness/dizziness, slurred speech. Follow up with me in 3 months, sooner if you need me

## 2023-08-28 NOTE — Assessment & Plan Note (Signed)
Patient currently maintained on lisinopril 20 mg daily.  Blood pressure well-controlled.  Patient denies any lightheaded or dizziness in regards to that continue medication as prescribed

## 2023-08-28 NOTE — Progress Notes (Signed)
Established Patient Office Visit  Subjective   Patient ID: Eugene Mata, male    DOB: [birth year]  Age: 42 y.o. MRN: [id number]  Chief Complaint  Patient presents with   Follow-up    Pt states maintaining fine on zepbound.     HPI  Morbid obesity: Patient currently maintained on tirzepatide 7.5 mg. States taht he will get some gas and heart burn. States that if he does a soda it makes it worse. States that he is doing yard work and house work. States that   States that he will do 1-2 meals a day. Sates that in the morning he will get coffee and then eat a dinner time. States that sometimes he will eat lunch. States that he will do a meat and side    Hypertension: Patient currently maintained on lisinopril 20 mg daily  Vision changes: state that his vision would go blurry and then the right side upper extremity would go tingling. States that it hasppend a few weeks ago and has happened previous. States that it last approx and will take a break from the computer. States that he will get a headache after. No weakness.     Review of Systems  Constitutional:  Negative for chills and fever.  Respiratory:  Negative for shortness of breath.   Cardiovascular:  Negative for chest pain.  Neurological:  Positive for tingling. Negative for dizziness, weakness and headaches.  Psychiatric/Behavioral:  Negative for hallucinations and suicidal ideas.       Objective:     BP 130/88   Pulse 80   Temp 97.9 F (36.6 C) (Oral)   Ht 5\' 8"  (1.727 m)   Wt 295 lb 3.2 oz (133.9 kg)   SpO2 97%   BMI 44.89 kg/m  BP Readings from Last 3 Encounters:  08/28/23 130/88  05/29/23 (!) 130/90  01/17/23 110/62   Wt Readings from Last 3 Encounters:  08/28/23 295 lb 3.2 oz (133.9 kg)  05/29/23 (!) 312 lb 12.8 oz (141.9 kg)  01/17/23 (!) 336 lb 4 oz (152.5 kg)   SpO2 Readings from Last 3 Encounters:  08/28/23 97%  05/29/23 94%  01/17/23 96%      Physical Exam Vitals and nursing note  reviewed.  Constitutional:      Appearance: Normal appearance.  HENT:     Mouth/Throat:     Mouth: Mucous membranes are moist.     Pharynx: Oropharynx is clear.  Eyes:     Extraocular Movements: Extraocular movements intact.     Pupils: Pupils are equal, round, and reactive to light.  Cardiovascular:     Rate and Rhythm: Normal rate and regular rhythm.     Heart sounds: Normal heart sounds.  Pulmonary:     Effort: Pulmonary effort is normal.     Breath sounds: Normal breath sounds.  Abdominal:     General: Bowel sounds are normal.  Neurological:     General: No focal deficit present.     Mental Status: He is alert.     Deep Tendon Reflexes:     Reflex Scores:      Bicep reflexes are 2+ on the right side and 2+ on the left side.      Patellar reflexes are 2+ on the right side and 2+ on the left side.    Comments: Lateral upper and lower extremity strength 5/5      No results found for any visits on 08/28/23.    The 10-year ASCVD risk  score (Arnett DK, et al., 2019) is: 8.7%    Assessment & Plan:   Problem List Items Addressed This Visit       Cardiovascular and Mediastinum   HTN (hypertension) - Primary    Patient currently maintained on lisinopril 20 mg daily.  Blood pressure well-controlled.  Patient denies any lightheaded or dizziness in regards to that continue medication as prescribed        Other   Morbid obesity (HCC)    Patient is continue to lose weight on Zepbound medication.  Continue working lifestyle modifications and using medication as directed.      Prediabetes    Continue working on lifestyle modifications continue Zepbound as prescribed      Vision changes    Ambiguous in nature could be secondary to computer screen use in a dark room setting.  Patient does have corrective lenses but is for distance not for close-up.  Query possible complex migraine.  Stable at this juncture neurological exam totally benign in office      Paresthesia     Ambiguous in nature neurological exam intact today.  It was over 2 weeks ago query complex migraine.  Patient does work on a computer at home and this could be the culprit for his report       Return in about 3 months (around 11/26/2023) for Weight/medication recheck .    Audria Nine, NP

## 2023-08-28 NOTE — Assessment & Plan Note (Signed)
Continue working on lifestyle modifications continue Zepbound as prescribed

## 2023-08-28 NOTE — Assessment & Plan Note (Signed)
Ambiguous in nature could be secondary to computer screen use in a dark room setting.  Patient does have corrective lenses but is for distance not for close-up.  Query possible complex migraine.  Stable at this juncture neurological exam totally benign in office

## 2023-09-06 DIAGNOSIS — G4733 Obstructive sleep apnea (adult) (pediatric): Secondary | ICD-10-CM | POA: Diagnosis not present

## 2023-09-19 ENCOUNTER — Other Ambulatory Visit: Payer: Self-pay | Admitting: Nurse Practitioner

## 2023-09-19 DIAGNOSIS — I10 Essential (primary) hypertension: Secondary | ICD-10-CM

## 2023-10-10 ENCOUNTER — Encounter: Payer: Self-pay | Admitting: Nurse Practitioner

## 2023-10-10 MED ORDER — ZEPBOUND 10 MG/0.5ML ~~LOC~~ SOAJ: 10.0000 mg | SUBCUTANEOUS | 0 refills | Status: DC

## 2023-11-21 ENCOUNTER — Ambulatory Visit: Payer: BC Managed Care – PPO | Admitting: Podiatry

## 2023-11-21 ENCOUNTER — Ambulatory Visit

## 2023-11-21 ENCOUNTER — Encounter: Payer: Self-pay | Admitting: Podiatry

## 2023-11-21 VITALS — Ht 68.0 in | Wt 295.2 lb

## 2023-11-21 DIAGNOSIS — M7752 Other enthesopathy of left foot: Secondary | ICD-10-CM | POA: Diagnosis not present

## 2023-11-21 NOTE — Progress Notes (Signed)
   Chief Complaint  Patient presents with   Callouses    Pt is here due to callous on the bottom of his left foot which is causing him pain when he walks.    HPI: 43 y.o. male presenting today for evaluation of left foot pain possibly caused by his orthotics.  Patient has a new pair of orthotics and he began wearing them around July.  After that time he says that he began to notice some left foot pain.  Past Medical History:  Diagnosis Date   Hypertension    Sleep apnea    uses CPAP at home    Past Surgical History:  Procedure Laterality Date   COLONOSCOPY WITH PROPOFOL N/A 03/08/2018   Procedure: COLONOSCOPY WITH PROPOFOL;  Surgeon: Christena Deem, MD;  Location: [facility] ENDOSCOPY;  Service: Endoscopy;  Laterality: N/A;   COLONOSCOPY WITH PROPOFOL N/A 01/16/2023   Procedure: COLONOSCOPY WITH PROPOFOL;  Surgeon: Toney Reil, MD;  Location[facility] ENDOSCOPY;  Service: Gastroenterology;  Laterality: N/A;   head cyst removal     WISDOM TOOTH EXTRACTION      No Known Allergies   Physical Exam: General: The patient is alert and oriented x3 in no acute distress.  Dermatology: Skin is warm, dry and supple bilateral lower extremities.   Vascular: Palpable pedal pulses bilaterally. Capillary refill within normal limits.  No appreciable edema.  No erythema.  Neurological: Grossly intact via light touch  Musculoskeletal Exam: No pedal deformities noted.  There is some mild tenderness to palpation just distal to the plantar heel along the lateral column of the foot  Assessment/Plan of Care: 1.  Lateral column foot pain left  -Patient evaluated -Patient believes that the pain may be coming from his orthotics.  He just began wearing his orthotics today after prolonged period of not wearing his orthotics.   -Recommend wearing his orthotics over the following few weeks and if the pain returns recommend that he contact our office and sets up an appointment for orthotics modification  with our orthotics department -Return to clinic with me as needed       Felecia Shelling, DPM [facility]  Dr. Felecia Shelling, DPM    [address] [place], [place] [address]                Office [phone]  Fax [phone]

## 2023-11-27 ENCOUNTER — Encounter: Payer: Self-pay | Admitting: Nurse Practitioner

## 2023-11-27 ENCOUNTER — Ambulatory Visit (INDEPENDENT_AMBULATORY_CARE_PROVIDER_SITE_OTHER): Payer: BC Managed Care – PPO | Admitting: Nurse Practitioner

## 2023-11-27 VITALS — BP 110/62 | HR 89 | Temp 98.6°F | Ht 68.0 in | Wt 287.0 lb

## 2023-11-27 DIAGNOSIS — Z720 Tobacco use: Secondary | ICD-10-CM | POA: Diagnosis not present

## 2023-11-27 DIAGNOSIS — I10 Essential (primary) hypertension: Secondary | ICD-10-CM | POA: Diagnosis not present

## 2023-11-27 DIAGNOSIS — Z6841 Body Mass Index (BMI) 40.0 and over, adult: Secondary | ICD-10-CM

## 2023-11-27 DIAGNOSIS — E7800 Pure hypercholesterolemia, unspecified: Secondary | ICD-10-CM

## 2023-11-27 DIAGNOSIS — R7303 Prediabetes: Secondary | ICD-10-CM

## 2023-11-27 LAB — COMPREHENSIVE METABOLIC PANEL
ALT: 14 U/L (ref 0–53)
AST: 13 U/L (ref 0–37)
Albumin: 4 g/dL (ref 3.5–5.2)
Alkaline Phosphatase: 61 U/L (ref 39–117)
BUN: 10 mg/dL (ref 6–23)
CO2: 28 meq/L (ref 19–32)
Calcium: 9.3 mg/dL (ref 8.4–10.5)
Chloride: 102 meq/L (ref 96–112)
Creatinine, Ser: 0.82 mg/dL (ref 0.40–1.50)
GFR: 108.29 mL/min (ref >60.00)
Glucose, Bld: 85 mg/dL (ref 70–99)
Potassium: 4.4 meq/L (ref 3.5–5.1)
Sodium: 138 meq/L (ref 135–145)
Total Bilirubin: 0.4 mg/dL (ref 0.2–1.2)
Total Protein: 6.6 g/dL (ref 6.0–8.3)

## 2023-11-27 LAB — URINALYSIS, MICROSCOPIC ONLY: RBC / HPF: NONE SEEN (ref 0–?)

## 2023-11-27 LAB — CBC
HCT: 44.6 % (ref 39.0–52.0)
Hemoglobin: 15.1 g/dL (ref 13.0–17.0)
MCHC: 33.9 g/dL (ref 30.0–36.0)
MCV: 93.4 fl (ref 78.0–100.0)
Platelets: 253 10*3/uL (ref 150.0–400.0)
RBC: 4.78 Mil/uL (ref 4.22–5.81)
RDW: 14.3 % (ref 11.5–15.5)
WBC: 8.5 10*3/uL (ref 4.0–10.5)

## 2023-11-27 LAB — LIPID PANEL
Cholesterol: 190 mg/dL (ref 0–200)
HDL: 32.5 mg/dL — ABNORMAL LOW (ref >39.00)
LDL Cholesterol: 140 mg/dL — ABNORMAL HIGH (ref 0–99)
NonHDL: 157.82
Total CHOL/HDL Ratio: 6
Triglycerides: 87 mg/dL (ref 0.0–149.0)
VLDL: 17.4 mg/dL (ref 0.0–40.0)

## 2023-11-27 LAB — TSH: TSH: 1.07 u[IU]/mL (ref 0.35–5.50)

## 2023-11-27 LAB — HEMOGLOBIN A1C: Hgb A1c MFr Bld: 5.4 % (ref 4.6–6.5)

## 2023-11-27 NOTE — Assessment & Plan Note (Signed)
 Currently maintained on lisinopril 20 mg daily.  Patient is tolerating medication well.  Blood pressure well-controlled.  He is not having any symptoms of hypotension thus far.  Continue medication as prescribed

## 2023-11-27 NOTE — Assessment & Plan Note (Signed)
 Patient currently maintained on Zepbound 10 mg weekly.  Doing well with medication will anticipate to titrate up to Zepbound 12.5 mg weekly he will reach out to me via MyChart when he has approximately 3 weeks left of the medication as he does doing the order pharmacy.  Continue medication as prescribed continue working healthy lifestyle modifications.

## 2023-11-27 NOTE — Progress Notes (Signed)
 Established Patient Office Visit  Subjective   Patient ID: Eugene Mata, male    DOB: 1980/10/28  Age: 43 y.o. MRN: [id number]  Chief Complaint  Patient presents with   Weight Check    HPI  Obesity: patient is currently maintained on zepbound 10mg  daily   States that he is drinking coffee in the am and then eat dinner. He will occassionally grab a muffin in the am. States that the coffee fills up. He will do home cooked and eating out 50/50. He will also do water  States that he is doing very little exercise. He will walk around on occasion.  States that he has had one episode of blured vision since her last visit.  Does not endorse pain.  He does wear glasses.  He also works on Production designer, theatre/television/film.  States that will resolve after taking a break from computer work.  He is needing an updated eye exam at this clinic 2 or 3 years per his report    Review of Systems  Constitutional:  Negative for chills and fever.  Respiratory:  Negative for shortness of breath.   Cardiovascular:  Negative for chest pain.  Gastrointestinal:  Negative for abdominal pain, constipation, nausea and vomiting.  Neurological:  Negative for dizziness and headaches.      Objective:     BP 110/62   Pulse 89   Temp 98.6 F (37 C) (Oral)   Ht 5\' 8"  (1.727 m)   Wt 287 lb (130.2 kg)   SpO2 97%   BMI 43.64 kg/m  BP Readings from Last 3 Encounters:  11/27/23 110/62  08/28/23 130/88  05/29/23 (!) 130/90   Wt Readings from Last 3 Encounters:  11/27/23 287 lb (130.2 kg)  11/21/23 295 lb 3.2 oz (133.9 kg)  08/28/23 295 lb 3.2 oz (133.9 kg)   SpO2 Readings from Last 3 Encounters:  11/27/23 97%  08/28/23 97%  05/29/23 94%      Physical Exam Vitals and nursing note reviewed.  Constitutional:      Appearance: Normal appearance.  Cardiovascular:     Rate and Rhythm: Normal rate and regular rhythm.     Heart sounds: Normal heart sounds.  Pulmonary:     Effort: Pulmonary effort is normal.      Breath sounds: Normal breath sounds.  Abdominal:     General: Bowel sounds are normal.     Tenderness: There is no abdominal tenderness.  Neurological:     Mental Status: He is alert.      No results found for any visits on 11/27/23.    The 10-year ASCVD risk score (Arnett DK, et al., 2019) is: 6.5%    Assessment & Plan:   Problem List Items Addressed This Visit       Cardiovascular and Mediastinum   HTN (hypertension) - Primary   Currently maintained on lisinopril 20 mg daily.  Patient is tolerating medication well.  Blood pressure well-controlled.  He is not having any symptoms of hypotension thus far.  Continue medication as prescribed      Relevant Orders   CBC   Comprehensive metabolic panel   TSH     Other   Morbid obesity (HCC)   Patient currently maintained on Zepbound 10 mg weekly.  Doing well with medication will anticipate to titrate up to Zepbound 12.5 mg weekly he will reach out to me via MyChart when he has approximately 3 weeks left of the medication as he does doing the order pharmacy.  Continue medication as prescribed continue working healthy lifestyle modifications.      Relevant Orders   Hemoglobin A1c   TSH   Tobacco use   Relevant Orders   Urine Microscopic   Prediabetes   Relevant Orders   Hemoglobin A1c   Pure hypercholesterolemia   Relevant Orders   Lipid panel    Return in about 3 months (around 02/27/2024) for CPE .    Audria Nine, NP

## 2023-11-29 ENCOUNTER — Encounter: Payer: Self-pay | Admitting: Nurse Practitioner

## 2023-12-19 ENCOUNTER — Telehealth: Payer: Self-pay

## 2023-12-19 ENCOUNTER — Other Ambulatory Visit (HOSPITAL_COMMUNITY): Payer: Self-pay

## 2023-12-19 NOTE — Telephone Encounter (Signed)
 Pharmacy Patient Advocate Encounter   Received notification from CoverMyMeds that prior authorization for Zepbound 10MG /0.5ML solution is due for renewal.   Insurance verification completed.   The patient is insured through [facility].  Action: PA required; PA submitted to above mentioned insurance via CoverMyMeds Key/confirmation #/EOC [id number] Status is pending

## 2023-12-20 ENCOUNTER — Encounter: Payer: Self-pay | Admitting: Nurse Practitioner

## 2023-12-20 NOTE — Telephone Encounter (Signed)
 Contacted pt and advised him to reach out to his insurance or [employer] in regards to zepbound not being covered.  Pt stated that he was mailed a new insurance card and is wondering if that is the reason he was denied.   Advised the pt to urgently verify if that was the case so that we can move forward with treatment plan or uploading a new card on file.  Pt verbalized understanding and stated that he will reach out to them.

## 2023-12-20 NOTE — Telephone Encounter (Signed)
 Can we let the patient know that it looks like his insurance is no longer covering zepbound. He can call his insurance to verify

## 2023-12-20 NOTE — Telephone Encounter (Signed)
 Pharmacy Patient Advocate Encounter  Received notification from [facility] that Prior Authorization for Zepbound 10MG /0.5ML solution has been denied due to Plan Exclusion   PA #/Case ID/Reference #: [id number]

## 2023-12-27 MED ORDER — ZEPBOUND 12.5 MG/0.5ML ~~LOC~~ SOAJ: 12.5000 mg | SUBCUTANEOUS | 0 refills | Status: DC

## 2023-12-27 NOTE — Addendum Note (Signed)
 Addended by: Eden Emms on: 12/27/2023 01:26 PM   Modules accepted: Orders

## 2024-01-22 ENCOUNTER — Telehealth: Payer: Self-pay

## 2024-01-22 ENCOUNTER — Other Ambulatory Visit (HOSPITAL_COMMUNITY): Payer: Self-pay

## 2024-01-22 NOTE — Telephone Encounter (Signed)
 Pharmacy Patient Advocate Encounter  Received notification from [employer] that Prior Authorization for Zepbound  12.5 has been APPROVED from 01/22/24 to 07/24/24. Ran test claim, Copay is $24.99. This test claim was processed through [facility]- copay amounts may vary at other pharmacies due to pharmacy/plan contracts, or as the patient moves through the different stages of their insurance plan.   PA #/Case ID/Reference #: [id number]

## 2024-01-22 NOTE — Telephone Encounter (Signed)
 Pharmacy Patient Advocate Encounter   Received notification from Patient Pharmacy that prior authorization for Zepbound  12.5 is required/requested.   Insurance verification completed.   The patient is insured through [facility] .   Per test claim: PA required; PA submitted to above mentioned insurance via CoverMyMeds Key/confirmation #/EOC [id number] Status is pending

## 2024-03-15 ENCOUNTER — Other Ambulatory Visit: Payer: Self-pay | Admitting: Nurse Practitioner

## 2024-03-18 NOTE — Telephone Encounter (Signed)
 Can we see if patient is ready for the next dose up? He is overdue for an office visit with the zepbound . Schedule as soon as possible

## 2024-03-19 NOTE — Telephone Encounter (Signed)
 Called pt.  Pt states that he is ready to up in dose for zepbound .  Pt cannot come in the office on 7/3 so pt is scheduled for 04/10/24 @920 

## 2024-03-26 ENCOUNTER — Encounter: Payer: Self-pay | Admitting: Nurse Practitioner

## 2024-03-27 MED ORDER — ZEPBOUND 15 MG/0.5ML ~~LOC~~ SOAJ: 15.0000 mg | SUBCUTANEOUS | 0 refills | Status: DC

## 2024-04-10 ENCOUNTER — Encounter: Payer: Self-pay | Admitting: Nurse Practitioner

## 2024-04-10 ENCOUNTER — Ambulatory Visit: Admitting: Nurse Practitioner

## 2024-04-10 VITALS — BP 116/62 | HR 72 | Temp 97.7°F | Ht 68.0 in | Wt 262.6 lb

## 2024-04-10 DIAGNOSIS — Z0000 Encounter for general adult medical examination without abnormal findings: Secondary | ICD-10-CM | POA: Diagnosis not present

## 2024-04-10 DIAGNOSIS — R7303 Prediabetes: Secondary | ICD-10-CM | POA: Diagnosis not present

## 2024-04-10 DIAGNOSIS — I10 Essential (primary) hypertension: Secondary | ICD-10-CM

## 2024-04-10 DIAGNOSIS — E7800 Pure hypercholesterolemia, unspecified: Secondary | ICD-10-CM

## 2024-04-10 DIAGNOSIS — Z720 Tobacco use: Secondary | ICD-10-CM | POA: Diagnosis not present

## 2024-04-10 DIAGNOSIS — G4733 Obstructive sleep apnea (adult) (pediatric): Secondary | ICD-10-CM | POA: Diagnosis not present

## 2024-04-10 DIAGNOSIS — E669 Obesity, unspecified: Secondary | ICD-10-CM

## 2024-04-10 LAB — CBC WITH DIFFERENTIAL/PLATELET
Basophils Absolute: 0 K/uL (ref 0.0–0.1)
Basophils Relative: 0.6 % (ref 0.0–3.0)
Eosinophils Absolute: 0.1 K/uL (ref 0.0–0.7)
Eosinophils Relative: 0.9 % (ref 0.0–5.0)
HCT: 45.1 % (ref 39.0–52.0)
Hemoglobin: 15.2 g/dL (ref 13.0–17.0)
Lymphocytes Relative: 37.9 % (ref 12.0–46.0)
Lymphs Abs: 3 K/uL (ref 0.7–4.0)
MCHC: 33.8 g/dL (ref 30.0–36.0)
MCV: 93 fl (ref 78.0–100.0)
Monocytes Absolute: 0.5 K/uL (ref 0.1–1.0)
Monocytes Relative: 6.6 % (ref 3.0–12.0)
Neutro Abs: 4.3 K/uL (ref 1.4–7.7)
Neutrophils Relative %: 54 % (ref 43.0–77.0)
Platelets: 214 K/uL (ref 150.0–400.0)
RBC: 4.85 Mil/uL (ref 4.22–5.81)
RDW: 14.3 % (ref 11.5–15.5)
WBC: 8 K/uL (ref 4.0–10.5)

## 2024-04-10 LAB — LIPID PANEL
Cholesterol: 199 mg/dL (ref 0–200)
HDL: 31.8 mg/dL — ABNORMAL LOW (ref >39.00)
LDL Cholesterol: 154 mg/dL — ABNORMAL HIGH (ref 0–99)
NonHDL: 166.72
Total CHOL/HDL Ratio: 6
Triglycerides: 62 mg/dL (ref 0.0–149.0)
VLDL: 12.4 mg/dL (ref 0.0–40.0)

## 2024-04-10 LAB — COMPREHENSIVE METABOLIC PANEL WITH GFR
ALT: 13 U/L (ref 0–53)
AST: 14 U/L (ref 0–37)
Albumin: 4.1 g/dL (ref 3.5–5.2)
Alkaline Phosphatase: 60 U/L (ref 39–117)
BUN: 7 mg/dL (ref 6–23)
CO2: 26 meq/L (ref 19–32)
Calcium: 9.3 mg/dL (ref 8.4–10.5)
Chloride: 101 meq/L (ref 96–112)
Creatinine, Ser: 0.85 mg/dL (ref 0.40–1.50)
GFR: 106.84 mL/min (ref >60.00)
Glucose, Bld: 84 mg/dL (ref 70–99)
Potassium: 4.3 meq/L (ref 3.5–5.1)
Sodium: 136 meq/L (ref 135–145)
Total Bilirubin: 0.5 mg/dL (ref 0.2–1.2)
Total Protein: 6.9 g/dL (ref 6.0–8.3)

## 2024-04-10 LAB — URINALYSIS, MICROSCOPIC ONLY
RBC / HPF: NONE SEEN (ref 0–?)
WBC, UA: NONE SEEN (ref 0–?)

## 2024-04-10 LAB — HEMOGLOBIN A1C: Hgb A1c MFr Bld: 5.5 % (ref 4.6–6.5)

## 2024-04-10 LAB — TSH: TSH: 1.16 u[IU]/mL (ref 0.35–5.50)

## 2024-04-10 MED ORDER — LISINOPRIL 20 MG PO TABS: 10.0000 mg | ORAL_TABLET | Freq: Every day | ORAL | Status: DC

## 2024-04-10 NOTE — Assessment & Plan Note (Addendum)
 History of the same.  Patient is working on weight loss has lost over 100 pounds total thus far.  Pending lipid panel

## 2024-04-10 NOTE — Assessment & Plan Note (Signed)
 History of the same patient currently maintained on CPAP.  Patient is starting to have episodes of dozing off of the day unsure if it is a CPAP or not states is an auto titrating PAP: We can always do a titration sleep study patient like to defer right now.  Patient is lost over 100 pounds I doubt that the pressure setting needs to be increased

## 2024-04-10 NOTE — Assessment & Plan Note (Signed)
History of the same pending A1c

## 2024-04-10 NOTE — Patient Instructions (Signed)
 Nice to see you today I will be in touch with the labs once I have them Half the lisinopril  daily. Taking a total of 10mg  daily Follow up with me in 3 months, sooner if you need me

## 2024-04-10 NOTE — Progress Notes (Signed)
 Established Patient Office Visit  Subjective   Patient ID: Eugene Mata, male    DOB: 02/26/1981  Age: 43 y.o. MRN: [id number]  Chief Complaint  Patient presents with   Follow-up    Zepbound     HPI  Hypertension: Patient currently maintained on lisinopril  20 mg daily. States that he does miss it sometimes. He is having some lightheaded and dizziness.  Obesity: Patient currently maintained on zepbound  50 mg weekly  OSA: Patient currently maintained on therapy. States that he did   for complete physical and follow up of chronic conditions.  Immunizations: -Tetanus: Completed in 2023 -Influenza: Out of season -Shingles: Too young -Pneumonia: refused -HPV: refused   Diet: Fair diet. He is eating 1 meal a day and a snack. He is drinking a soda a day water  and coffee Exercise: No regular exercise.  Eye exam: PRN Dental exam: Completes semi-annually    Colonoscopy:  01/16/2023, repeat 5 years Lung Cancer Screening: too young    PSA: Due  Sleep: states that he is getting to bed aroun 1130-12 and will get u arpond 530-6 on work days and wekiends 8-9. States that he is not feeling rested. He is unsure if he needs a new sleep study. States that he is dozing on and off and will wear it 5-6 hours.        Review of Systems  Constitutional:  Negative for chills and fever.  Respiratory:  Negative for shortness of breath.   Cardiovascular:  Negative for chest pain and leg swelling.  Gastrointestinal:  Negative for abdominal pain, blood in stool, constipation, diarrhea, nausea and vomiting.       BM daily  Genitourinary:  Negative for dysuria and hematuria.  Neurological:  Positive for dizziness. Negative for tingling and headaches.  Psychiatric/Behavioral:  Negative for hallucinations and suicidal ideas.       Objective:     BP 116/62   Pulse 72   Temp 97.7 F (36.5 C) (Oral)   Ht 5' 8 (1.727 m)   Wt 262 lb 9.6 oz (119.1 kg)   SpO2 98%   BMI 39.93 kg/m  BP  Readings from Last 3 Encounters:  04/10/24 116/62  11/27/23 110/62  08/28/23 130/88   Wt Readings from Last 3 Encounters:  04/10/24 262 lb 9.6 oz (119.1 kg)  11/27/23 287 lb (130.2 kg)  11/21/23 295 lb 3.2 oz (133.9 kg)   SpO2 Readings from Last 3 Encounters:  04/10/24 98%  11/27/23 97%  08/28/23 97%      Physical Exam Vitals and nursing note reviewed.  Constitutional:      Appearance: Normal appearance.  HENT:     Right Ear: Tympanic membrane, ear canal and external ear normal.     Left Ear: Tympanic membrane, ear canal and external ear normal.     Mouth/Throat:     Mouth: Mucous membranes are moist.     Pharynx: Oropharynx is clear.  Eyes:     Extraocular Movements: Extraocular movements intact.     Pupils: Pupils are equal, round, and reactive to light.  Cardiovascular:     Rate and Rhythm: Normal rate and regular rhythm.     Pulses: Normal pulses.     Heart sounds: Normal heart sounds.  Pulmonary:     Effort: Pulmonary effort is normal.     Breath sounds: Normal breath sounds.  Abdominal:     General: Bowel sounds are normal. There is no distension.     Palpations: There is no  mass.     Tenderness: There is no abdominal tenderness.     Hernia: No hernia is present.  Genitourinary:    Comments: deferred Musculoskeletal:     Right lower leg: No edema.     Left lower leg: No edema.  Lymphadenopathy:     Cervical: No cervical adenopathy.  Skin:    General: Skin is warm.  Neurological:     General: No focal deficit present.     Mental Status: He is alert.     Deep Tendon Reflexes:     Reflex Scores:      Bicep reflexes are 2+ on the right side and 2+ on the left side.      Patellar reflexes are 2+ on the right side and 2+ on the left side.    Comments: Bilateral upper and lower extremity strength 5/5  Psychiatric:        Mood and Affect: Mood normal.        Behavior: Behavior normal.        Thought Content: Thought content normal.        Judgment:  Judgment normal.      No results found for any visits on 04/10/24.    The 10-year ASCVD risk score (Arnett DK, et al., 2019) is: 6.9%    Assessment & Plan:   Problem List Items Addressed This Visit       Cardiovascular and Mediastinum   HTN (hypertension)   Patient currently maintained on lisinopril  20 mg daily.  Patient not been taking every day blood pressure control patient is experiencing lightheadedness and dizziness in the setting of losing over 100 pounds we will cut lisinopril  in half to 10 mg daily.      Relevant Medications   lisinopril  (ZESTRIL ) 20 MG tablet   Other Relevant Orders   CBC with Differential/Platelet   Comprehensive metabolic panel with GFR   Hemoglobin A1c   Lipid panel     Respiratory   Sleep apnea, obstructive   History of the same patient currently maintained on CPAP.  Patient is starting to have episodes of dozing off of the day unsure if it is a CPAP or not states is an auto titrating PAP: We can always do a titration sleep study patient like to defer right now.  Patient is lost over 100 pounds I doubt that the pressure setting needs to be increased      Relevant Orders   CBC with Differential/Platelet     Other   Tobacco use   Patient not interested in cessation at this juncture.  Work on cutting back on the smoking.  Pending urine microscopy to rule out microscopic hematuria      Relevant Orders   CBC with Differential/Platelet   Urine Microscopic   Prediabetes   History of the same pending A1c      Relevant Orders   Hemoglobin A1c   Lipid panel   Pure hypercholesterolemia   History of the same.  Patient is working on weight loss has lost over 100 pounds total thus far.  Pending lipid panel      Relevant Medications   lisinopril  (ZESTRIL ) 20 MG tablet   Other Relevant Orders   Hemoglobin A1c   Lipid panel   Preventative health care - Primary   Discussed age-appropriate immunizations and screening exams.  Did review  patient's personal, surgical, social, family histories.  Patient is up-to-date on all age-appropriate vaccinations he would like.  Patient declined HPV and Prevnar 20  today.  Patient is up-to-date on CRC screening.  Patient screening for prostate cancer screening.  Patient was given information at discharge about preventative healthcare maintenance with anticipatory guidance.      Relevant Orders   CBC with Differential/Platelet   Comprehensive metabolic panel with GFR   TSH   Other Visit Diagnoses       Obesity (BMI 30-39.9)         Essential hypertension       Relevant Medications   lisinopril  (ZESTRIL ) 20 MG tablet       Return in about 3 months (around 07/11/2024) for BP recheck/ zepbound /weight .    Adina Crandall, NP

## 2024-04-10 NOTE — Assessment & Plan Note (Signed)
 Discussed age-appropriate immunizations and screening exams.  Did review patient's personal, surgical, social, family histories.  Patient is up-to-date on all age-appropriate vaccinations he would like.  Patient declined HPV and Prevnar 20 today.  Patient is up-to-date on CRC screening.  Patient screening for prostate cancer screening.  Patient was given information at discharge about preventative healthcare maintenance with anticipatory guidance.

## 2024-04-10 NOTE — Assessment & Plan Note (Signed)
 Patient currently maintained on lisinopril  20 mg daily.  Patient not been taking every day blood pressure control patient is experiencing lightheadedness and dizziness in the setting of losing over 100 pounds we will cut lisinopril  in half to 10 mg daily.

## 2024-04-10 NOTE — Assessment & Plan Note (Signed)
 Patient not interested in cessation at this juncture.  Work on cutting back on the smoking.  Pending urine microscopy to rule out microscopic hematuria

## 2024-04-11 ENCOUNTER — Ambulatory Visit: Payer: Self-pay | Admitting: Nurse Practitioner

## 2024-05-30 ENCOUNTER — Encounter: Payer: Self-pay | Admitting: Nurse Practitioner

## 2024-05-30 MED ORDER — ZEPBOUND 15 MG/0.5ML ~~LOC~~ SOAJ: 15.0000 mg | SUBCUTANEOUS | 0 refills | Status: DC

## 2024-05-30 NOTE — Addendum Note (Signed)
 Addended by: WENDEE LYNWOOD HERO on: 05/30/2024 03:14 PM   Modules accepted: Orders

## 2024-06-03 MED ORDER — LISINOPRIL 10 MG PO TABS: 10.0000 mg | ORAL_TABLET | Freq: Every day | ORAL | 3 refills | Status: DC

## 2024-06-03 NOTE — Addendum Note (Signed)
 Addended by: WENDEE LYNWOOD HERO on: 06/03/2024 04:39 PM   Modules accepted: Orders

## 2024-06-10 DIAGNOSIS — G4733 Obstructive sleep apnea (adult) (pediatric): Secondary | ICD-10-CM | POA: Diagnosis not present

## 2024-07-02 ENCOUNTER — Telehealth: Payer: Self-pay

## 2024-07-02 ENCOUNTER — Other Ambulatory Visit (HOSPITAL_COMMUNITY): Payer: Self-pay

## 2024-07-02 NOTE — Telephone Encounter (Signed)
 Pharmacy Patient Advocate Encounter   Received notification from Onbase that prior authorization for Zepbound  12.5 is required/requested.   Insurance verification completed.   The patient is insured through [facility].   Per test claim: PA required; PA submitted to above mentioned insurance via Latent Key/confirmation #/EOC [id number] Status is pending

## 2024-07-02 NOTE — Telephone Encounter (Signed)
 Pharmacy Patient Advocate Encounter  Received notification from [employer] that Prior Authorization for Zepbound  12.5 has been APPROVED from 07/02/24 to 12/31/24. Unable to obtain price due to refill too soon rejection, last fill date 05/30/24 next available fill date11/13/25   PA #/Case ID/Reference #: # [id number]

## 2024-07-04 DIAGNOSIS — D2262 Melanocytic nevi of left upper limb, including shoulder: Secondary | ICD-10-CM | POA: Diagnosis not present

## 2024-07-04 DIAGNOSIS — D2272 Melanocytic nevi of left lower limb, including hip: Secondary | ICD-10-CM | POA: Diagnosis not present

## 2024-07-04 DIAGNOSIS — L538 Other specified erythematous conditions: Secondary | ICD-10-CM | POA: Diagnosis not present

## 2024-07-04 DIAGNOSIS — D225 Melanocytic nevi of trunk: Secondary | ICD-10-CM | POA: Diagnosis not present

## 2024-07-04 DIAGNOSIS — D2261 Melanocytic nevi of right upper limb, including shoulder: Secondary | ICD-10-CM | POA: Diagnosis not present

## 2024-07-04 DIAGNOSIS — L720 Epidermal cyst: Secondary | ICD-10-CM | POA: Diagnosis not present

## 2024-07-04 DIAGNOSIS — D408 Neoplasm of uncertain behavior of other specified male genital organs: Secondary | ICD-10-CM | POA: Diagnosis not present

## 2024-07-19 ENCOUNTER — Ambulatory Visit: Admitting: Nurse Practitioner

## 2024-07-19 DIAGNOSIS — I10 Essential (primary) hypertension: Secondary | ICD-10-CM | POA: Diagnosis not present

## 2024-07-19 DIAGNOSIS — Z2821 Immunization not carried out because of patient refusal: Secondary | ICD-10-CM | POA: Insufficient documentation

## 2024-07-19 NOTE — Patient Instructions (Signed)
 Nice to see you today We will continue the zepbound  We will DISCONTINUE the lisinopril  Follow up with me in 3 months, sooner if you need me

## 2024-07-19 NOTE — Progress Notes (Signed)
 Established Patient Office Visit  Subjective   Patient ID: Eugene Mata, male    DOB: 24-Feb-1981  Age: 43 y.o. MRN: [id number]  Chief Complaint  Patient presents with   Follow-up    Pt complains that he has not been taking BP medication. States the last time he took the meds he became lightheaded.     HPI   Discussed the use of AI scribe software for clinical note transcription with the patient, who gave verbal consent to proceed.  History of Present Illness Eugene Mata is a 43 year old male who presents for follow-up regarding weight loss and blood pressure management.  He has experienced significant weight loss, losing almost 100 pounds. He is currently using Zepbound , with a supply of the 15 mg dose for the next three months. He eats one meal a day, typically dinner, and occasionally snacks on for breakfast. He primarily drinks coffee throughout the day, which he started consuming after beginning his current job.   His bowel movements are regular, occurring daily. He occasionally uses stool softeners to aid with bowel movements, as recommended by a friend's wife, and finds them helpful. He ensures adequate fluid intake, drinking plenty of water .  He stopped taking lisinopril  approximately two weeks ago due to feeling 'weird' after taking it, even at a lower dose. He has not experienced dizziness since discontinuing the medication. He does not currently have a reliable blood pressure cuff at home, as the one he purchased does not fit well.  He does not engage in regular exercise. He is currently staying at his mother's house, which has disrupted his usual morning routine, contributing to his inconsistent medication intake.     Review of Systems  Constitutional:  Negative for chills and fever.  Respiratory:  Negative for shortness of breath.   Cardiovascular:  Negative for chest pain.  Gastrointestinal:  Negative for abdominal pain and constipation.  Neurological:  Positive  for dizziness. Negative for headaches.  Psychiatric/Behavioral:  Negative for hallucinations and suicidal ideas.       Objective:     BP 120/82   Pulse 70   Temp 98 F (36.7 C) (Oral)   Ht 5' 8 (1.727 m)   Wt 249 lb 3.2 oz (113 kg)   SpO2 98%   BMI 37.89 kg/m  BP Readings from Last 3 Encounters:  07/19/24 120/82  04/10/24 116/62  11/27/23 110/62   Wt Readings from Last 3 Encounters:  07/19/24 249 lb 3.2 oz (113 kg)  04/10/24 262 lb 9.6 oz (119.1 kg)  11/27/23 287 lb (130.2 kg)   SpO2 Readings from Last 3 Encounters:  07/19/24 98%  04/10/24 98%  11/27/23 97%      Physical Exam Vitals and nursing note reviewed.  Constitutional:      Appearance: Normal appearance.  Cardiovascular:     Rate and Rhythm: Normal rate and regular rhythm.     Heart sounds: Normal heart sounds.  Pulmonary:     Effort: Pulmonary effort is normal.     Breath sounds: Normal breath sounds.  Abdominal:     General: Bowel sounds are normal.  Neurological:     Mental Status: He is alert.      No results found for any visits on 07/19/24.    The 10-year ASCVD risk score (Arnett DK, et al., 2019) is: 7.5%    Assessment & Plan:   Problem List Items Addressed This Visit       Cardiovascular and Mediastinum  HTN (hypertension)     Other   Morbid obesity (HCC) - Primary   Flu vaccine refused   Assessment and Plan Assessment & Plan Obesity Significant weight loss of almost 100 pounds. BMI still qualifies for Zepbound . Discussed potential dosage adjustment if weight loss continues significantly. Emphasized balanced diet and hydration to prevent weight regain if medication is discontinued. - Continue Zepbound  15 mg weekly as long as insurance and BMI qualify. - Encouraged balanced diet and adequate hydration. - Will discuss potential dosage adjustment if weight loss continues significantly.  Hypertension Blood pressure well-controlled without Lisinopril . Discussed importance of  home monitoring. - Discontinued Lisinopril  and removed from medication list. - Encouraged obtaining a reliable blood pressure cuff for home monitoring.  Return in about 3 months (around 10/19/2024) for weight and BP recheck .    Adina Crandall, NP

## 2024-08-12 ENCOUNTER — Encounter: Payer: Self-pay | Admitting: Nurse Practitioner

## 2024-08-14 MED ORDER — ZEPBOUND 15 MG/0.5ML ~~LOC~~ SOAJ: 15.0000 mg | SUBCUTANEOUS | 0 refills | Status: DC

## 2024-08-14 NOTE — Telephone Encounter (Signed)
 See my chart message please

## 2024-08-14 NOTE — Addendum Note (Signed)
 Addended by: WENDEE LYNWOOD HERO on: 08/14/2024 02:05 PM   Modules accepted: Orders

## 2024-08-19 ENCOUNTER — Other Ambulatory Visit (HOSPITAL_COMMUNITY): Payer: Self-pay

## 2024-08-19 ENCOUNTER — Telehealth: Payer: Self-pay

## 2024-08-19 NOTE — Telephone Encounter (Signed)
 Pharmacy Patient Advocate Encounter   Received notification from Patient Advice Request messages that prior authorization for Zepbound  15 is required/requested.   Insurance verification completed.   The patient is insured through [facility].   Per test claim: Refill too soon. PA is not needed at this time. Medication was filled 08/14/24. Next eligible fill date is 10/16/24.   Patient has current approved PA thru 12/31/24.

## 2024-09-09 DIAGNOSIS — G4733 Obstructive sleep apnea (adult) (pediatric): Secondary | ICD-10-CM | POA: Diagnosis not present

## 2024-10-23 ENCOUNTER — Ambulatory Visit: Payer: Self-pay | Admitting: Nurse Practitioner

## 2024-10-23 DIAGNOSIS — Z6836 Body mass index (BMI) 36.0-36.9, adult: Secondary | ICD-10-CM | POA: Diagnosis not present

## 2024-10-23 DIAGNOSIS — I10 Essential (primary) hypertension: Secondary | ICD-10-CM

## 2024-10-23 MED ORDER — ZEPBOUND 15 MG/0.5ML ~~LOC~~ SOAJ: 15.0000 mg | SUBCUTANEOUS | 1 refills | Status: AC

## 2024-10-23 NOTE — Patient Instructions (Signed)
 Nice to see you today  I want to see you in 3 months Try and work on getting more exercise in

## 2024-10-23 NOTE — Progress Notes (Signed)
 "  Established Patient Office Visit  Subjective   Patient ID: Eugene Mata, male    DOB: 04/19/1981  Age: 44 y.o. MRN: [id number]  Chief Complaint  Patient presents with   Follow-up    BP recheck and weight    HPI  Discussed the use of AI scribe software for clinical note transcription with the patient, who gave verbal consent to proceed.  History of Present Illness Eugene Mata is a 44 year old male who presents for a routine follow-up visit.  He stopped taking lisinopril  a few weeks  prior to his last office visit and has not experienced any dizziness since discontinuing the medication.  He continues to take Zepbound  at a dose of 15 mg. His weight has decreased from 249 pounds to 240 pounds since the last visit, despite the holiday season. He follows a dietary pattern of one meal a day, typically dinner, with occasional snacking throughout the day. Coffee remains his primary fluid intake. He engages in minimal exercise, primarily through home improvement activities as he is currently living with his mother and working on fixing up his house.  Bowel movements occur once or twice daily without difficulty. He experiences occasional reflux symptoms, particularly after consuming peanut butter or buttercream, which result in 'terrible gas' and burping for a day or so. No nausea, vomiting, or abdominal pain.  He continues to smoke approximately two packs of cigarettes per day.    Review of Systems  Constitutional:  Negative for chills and fever.  Respiratory:  Negative for shortness of breath.   Cardiovascular:  Negative for chest pain.  Gastrointestinal:  Positive for heartburn. Negative for abdominal pain, constipation, nausea and vomiting.  Neurological:  Negative for dizziness.      Objective:     BP 130/88   Pulse 75   Temp 98.1 F (36.7 C) (Oral)   Ht 5' 8 (1.727 m)   Wt 240 lb 3.2 oz (109 kg)   SpO2 98%   BMI 36.52 kg/m  BP Readings from Last 3 Encounters:   10/23/24 130/88  07/19/24 120/82  04/10/24 116/62   Wt Readings from Last 3 Encounters:  10/23/24 240 lb 3.2 oz (109 kg)  07/19/24 249 lb 3.2 oz (113 kg)  04/10/24 262 lb 9.6 oz (119.1 kg)   SpO2 Readings from Last 3 Encounters:  10/23/24 98%  07/19/24 98%  04/10/24 98%      Physical Exam Vitals and nursing note reviewed.  Constitutional:      Appearance: Normal appearance.  Cardiovascular:     Rate and Rhythm: Normal rate and regular rhythm.     Heart sounds: Normal heart sounds.  Pulmonary:     Effort: Pulmonary effort is normal.     Breath sounds: Normal breath sounds.  Neurological:     Mental Status: He is alert.      No results found for any visits on 10/23/24.    The 10-year ASCVD risk score (Arnett DK, et al., 2019) is: 8.6%    Assessment & Plan:   Problem List Items Addressed This Visit       Cardiovascular and Mediastinum   HTN (hypertension)     Other   Morbid obesity (HCC) - Primary   Relevant Medications   tirzepatide  (ZEPBOUND ) 15 MG/0.5ML Pen   Assessment and Plan Assessment & Plan Morbid obesity Weight reduced from 249 lbs to 240 lbs. Zepbound  15 mg weekly effective for weight loss. Advised against discontinuation due to ongoing weight loss and  potential insurance issues. - Continue Zepbound  15 mg weekly. - Submitted prescription for Zepbound  to pharmacy for three months. - Monitor weight and insurance coverage for Zepbound . - Encouraged to continue working on lifestyle modification and increasing exercise  Hypertension Blood pressure well-controlled without lisinopril . - Continue current management without lisinopril .   Return in about 3 months (around 01/20/2025) for BP recheck, weight recheck .    Adina Crandall, NP  "
